# Patient Record
Sex: Male | Born: 1975
Health system: Southern US, Community
[De-identification: ages and names within clinical notes are randomized; demographics above are authoritative.]

## PROBLEM LIST (undated history)

## (undated) DIAGNOSIS — A630 Anogenital (venereal) warts: Secondary | ICD-10-CM

## (undated) DIAGNOSIS — T7840XA Allergy, unspecified, initial encounter: Secondary | ICD-10-CM

## (undated) HISTORY — DX: Anogenital (venereal) warts: A63.0

## (undated) HISTORY — DX: Gilbert syndrome: E80.4

## (undated) HISTORY — DX: Allergy, unspecified, initial encounter: T78.40XA

---

## 1999-04-25 HISTORY — PX: LEG SURGERY: SHX1003

## 1999-05-02 ENCOUNTER — Encounter: Payer: Self-pay | Admitting: *Deleted

## 1999-05-02 ENCOUNTER — Ambulatory Visit (HOSPITAL_COMMUNITY): Admission: RE | Admit: 1999-05-02 | Discharge: 1999-05-02 | Payer: Self-pay | Admitting: *Deleted

## 2000-03-08 ENCOUNTER — Ambulatory Visit (HOSPITAL_BASED_OUTPATIENT_CLINIC_OR_DEPARTMENT_OTHER): Admission: RE | Admit: 2000-03-08 | Discharge: 2000-03-08 | Payer: Self-pay | Admitting: Orthopedic Surgery

## 2000-03-08 ENCOUNTER — Encounter (INDEPENDENT_AMBULATORY_CARE_PROVIDER_SITE_OTHER): Payer: Self-pay | Admitting: Specialist

## 2009-09-22 HISTORY — PX: FRACTURE SURGERY: SHX138

## 2009-11-05 ENCOUNTER — Ambulatory Visit (HOSPITAL_BASED_OUTPATIENT_CLINIC_OR_DEPARTMENT_OTHER): Admission: RE | Admit: 2009-11-05 | Discharge: 2009-11-06 | Payer: Self-pay | Admitting: Orthopedic Surgery

## 2010-07-09 LAB — POCT HEMOGLOBIN-HEMACUE: Hemoglobin: 14.6 g/dL (ref 13.0–17.0)

## 2010-09-09 NOTE — Op Note (Signed)
[place]  Patient:    Kelly Bush, Kelly Bush                         MRN: [id number] Proc. Date: 03/08/00 Adm. Date:  [id number] Attending:  Colbert Ewing                           Operative Report  PREOPERATIVE DIAGNOSIS:  Osteoid osteoma left distal fibula.  POSTOPERATIVE DIAGNOSIS:  Osteoid osteoma left distal fibula.  OPERATIVE PROCEDURE:  Excision osteoid osteoma and surrounding reactive bone distal fibula - left.  SURGEON:  Loreta Ave, M.D.  ASSISTANT:  Arlys John D. Petrarca, P.A.-C.  ANESTHESIA:  General.  BLOOD LOSS:  Minimal.  SPECIMENS:  Excised osteoid osteoma.  CULTURES:  None.  COMPLICATIONS:  None.  DRESSING:  Soft compressive.  TOURNIQUET TIME:  One hour.  DESCRIPTION OF PROCEDURE:  Patient brought to the operating room and after adequate anesthesia had been obtained, tourniquet applied about the upper aspect of the left leg.  Prepped and draped in usual sterile fashion. Exsanguinated with elevation, Esmarch tourniquet inflated to 300 mmHg. Straight incision over the fibula centralized over the lesion, which was on the anterolateral aspect of the fibula, 10-15 cm above the ankle joint. Subperiosteal exposure of the fibula.  The nidus within the fibula was isolated.  The bone was sequentially removed, removing all the reactive, hypertrophied bone down to the level of the lesion.  The lesion identified and excised in its entirety and sent for pathology.  The surrounding bone around this was thoroughly curetted to be sure all of the benign osteoid osteoma had been removed.  The fibula itself was then contoured smoothly with a combination of osteotomes and rongeurs to a smooth surface.  Sufficient cortical bone still remaining that I did not need to do any plate reinforcement after removal of the lesion.  Fluoroscopy was used to confirm adequate excision as well as, to confirm good bony contouring.  Wound  was thoroughly irrigated and closed in a layered manner with Vicryl and Steri-Strips.   Margins of the wound injected with Marcaine with epinephrine. Sterile compressive dressing applied.  Tourniquet deflated and removed. Anesthesia reversed.  Brought to recovery room.  Tolerated surgery well, no complications. DD:  03/08/00 TD:  03/08/00 Job: [id number]

## 2011-12-20 ENCOUNTER — Ambulatory Visit (INDEPENDENT_AMBULATORY_CARE_PROVIDER_SITE_OTHER): Payer: BC Managed Care – PPO | Admitting: Family

## 2011-12-20 ENCOUNTER — Encounter: Payer: Self-pay | Admitting: Family

## 2011-12-20 VITALS — BP 110/70 | HR 55 | Temp 98.1°F | Resp 16 | Ht 70.0 in | Wt 162.0 lb

## 2011-12-20 DIAGNOSIS — T148XXA Other injury of unspecified body region, initial encounter: Secondary | ICD-10-CM

## 2011-12-20 DIAGNOSIS — A630 Anogenital (venereal) warts: Secondary | ICD-10-CM

## 2011-12-20 NOTE — Assessment & Plan Note (Addendum)
No obvious hernia on exam today.  Recommended that he avoid sports for the next 1 week, then resume as tolerated. Also instructed him on short course of motrin prn.  Pt is instructed to let us know if pain worsens or if no improvement in the next few weeks.

## 2011-12-20 NOTE — Patient Instructions (Addendum)
Please use motrin as needed for the next 1 week. Call if symptoms worsen or if no improvement. Schedule a fasting physical at your convenience.

## 2011-12-20 NOTE — Progress Notes (Signed)
Subjective:    Patient ID: Kelly Bush, male    DOB: Mar 04, 1976, 36 y.o.   MRN: [id number]  HPI  Mr.  Bush is a 36 yr old male who presents today to establish care. He has chief complaint of lower abdominal/groin pain. Reports that he plays soccer every tues/wed.  Notes occasional left groin pain or right groin pain.  Worse when playing soccer.  Hurts with walking/sneezing/straining.  First noted groin pain several months ago. It has been intermittent in nature.  For the last 1 week he has noted some lower abdominal discomfort which is midline.  Denies n/v/hematochezia.  Denies constipation/diarrhea.    Genital warts- went to dermatologist in [place]- 2 years ago.  Returned.  He will make apt for removal.  He has had it frozen.    Reports osteoma 2001 left leg.    Review of Systems  Constitutional: Negative for unexpected weight change.  HENT: Negative for hearing loss and postnasal drip.   Eyes: Negative for visual disturbance.  Respiratory: Negative for cough.   Cardiovascular: Negative for chest pain.  Gastrointestinal: Negative for nausea, vomiting and diarrhea.  Genitourinary: Negative for frequency and hematuria.  Musculoskeletal: Negative for myalgias and arthralgias.  Neurological: Negative for headaches.  Hematological: Negative for adenopathy.  Psychiatric/Behavioral:       Denies depression or anxiety   Past Medical History  Diagnosis Date  . Genital warts     History   Social History  . Marital Status: Married    Spouse Name: N/A    Number of Children: 0  . Years of Education: N/A   Occupational History  .     Social History Main Topics  . Smoking status: Never Smoker   . Smokeless tobacco: Never Used  . Alcohol Use: Yes     once every 1 - 2 weeks  . Drug Use: Not on file  . Sexually Active: Not on file   Other Topics Concern  . Not on file   Social History Narrative   Regular exercise:  3 x weeklyWorks for localLives with 2 roomates.Bachelors  degreeNo Surveyor, minerals, movies, sports    Past Surgical History  Procedure Date  . Fracture surgery 09/2009    right leg fx, has rod in leg  . Leg surgery 2001    left leg surgery ?osteoma?    Family History  Problem Relation Age of Onset  . Diabetes Paternal Aunt   . Cancer Maternal Grandmother     breast    No Known Allergies  No current outpatient prescriptions on file prior to visit.    BP 110/70  Pulse 55  Temp 98.1 F (36.7 C) (Oral)  Resp 16  Ht 5\' 10"  (1.778 m)  Wt 162 lb (73.483 kg)  BMI 23.24 kg/m2  SpO2 99%       Objective:   Physical Exam  Constitutional: He is oriented to person, place, and time. He appears well-developed and well-nourished. No distress.  HENT:  Head: Normocephalic and atraumatic.  Cardiovascular: Normal rate and regular rhythm.   No murmur heard. Pulmonary/Chest: Effort normal and breath sounds normal. No respiratory distress. He has no wheezes. He has no rales. He exhibits no tenderness.  Abdominal: Soft. Bowel sounds are normal. He exhibits no distension and no mass. There is no tenderness. There is no rebound and no guarding.  Genitourinary:       No inguinal hernia palpated on right or left with cough.    Neurological: He is alert  and oriented to person, place, and time.  Skin: Skin is warm and dry.  Psychiatric: He has a normal mood and affect. His behavior is normal. Judgment and thought content normal.          Assessment & Plan:

## 2011-12-20 NOTE — Assessment & Plan Note (Signed)
Pt plans to arrange follow up with dermatology.

## 2012-07-18 ENCOUNTER — Ambulatory Visit (INDEPENDENT_AMBULATORY_CARE_PROVIDER_SITE_OTHER): Payer: BC Managed Care – PPO | Admitting: Family

## 2012-07-18 ENCOUNTER — Encounter: Payer: Self-pay | Admitting: Family

## 2012-07-18 VITALS — BP 104/72 | HR 58 | Temp 98.1°F | Resp 16 | Wt 170.1 lb

## 2012-07-18 DIAGNOSIS — R109 Unspecified abdominal pain: Secondary | ICD-10-CM

## 2012-07-18 DIAGNOSIS — Z0000 Encounter for general adult medical examination without abnormal findings: Secondary | ICD-10-CM | POA: Insufficient documentation

## 2012-07-18 DIAGNOSIS — R1030 Lower abdominal pain, unspecified: Secondary | ICD-10-CM | POA: Insufficient documentation

## 2012-07-18 LAB — CBC WITH DIFFERENTIAL/PLATELET
Basophils Absolute: 0 10*3/uL (ref 0.0–0.1)
HCT: 45.9 % (ref 39.0–52.0)
Hemoglobin: 15.8 g/dL (ref 13.0–17.0)
Lymphocytes Relative: 39 % (ref 12–46)
Monocytes Absolute: 0.6 10*3/uL (ref 0.1–1.0)
Monocytes Relative: 10 % (ref 3–12)
Neutro Abs: 3 10*3/uL (ref 1.7–7.7)
RBC: 5.32 MIL/uL (ref 4.22–5.81)
WBC: 6.1 10*3/uL (ref 4.0–10.5)

## 2012-07-18 NOTE — Patient Instructions (Addendum)
Please complete your lab work prior to leaving.  You will be contacted about your CT scan to evaluate for possible hernia. We will contact you with your results. Follow up in 1 year, sooner if problems/concerns.

## 2012-07-18 NOTE — Assessment & Plan Note (Signed)
Inguinal hernia exam neg last visit, but symptoms persist. Will obtain CT to further evaluate.

## 2012-07-18 NOTE — Assessment & Plan Note (Signed)
We discussed the importance of healthy diet.  Obtain fasting lab work.

## 2012-07-18 NOTE — Progress Notes (Signed)
Subjective:    Patient ID: Kelly Bush, male    DOB: January 09, 1976, 37 y.o.   MRN: [id number]  HPI  Preventative-  Reports last tetanus was <10 yrs. Exercise- Not exercising regularly.  Diet-  Reports that he started multivitamin.  Cooks a little.  Takes sandwiches for work.  Pt reports that he continues to have bilateral groin pain when he exercises.    Review of Systems  Constitutional: Negative for unexpected weight change.  HENT: Negative for hearing loss and congestion.   Eyes: Negative for visual disturbance.  Respiratory: Negative for cough and shortness of breath.   Cardiovascular: Negative for chest pain.  Gastrointestinal: Negative for nausea, vomiting and diarrhea.  Genitourinary: Negative for dysuria and frequency.  Neurological: Negative for headaches.  Hematological: Negative for adenopathy.  Psychiatric/Behavioral:       Denies depression/anxiety.   Past Medical History  Diagnosis Date  . Genital warts     History   Social History  . Marital Status: Married    Spouse Name: N/A    Number of Children: 0  . Years of Education: N/A   Occupational History  .     Social History Main Topics  . Smoking status: Never Smoker   . Smokeless tobacco: Never Used  . Alcohol Use: Yes     Comment: once every 1 - 2 weeks  . Drug Use: Not on file  . Sexually Active: Not on file   Other Topics Concern  . Not on file   Social History Narrative   Regular exercise:  3 x weekly   Works for [employer] with 2 roomates.   Bachelors degree   No children   Has Market researcher, movies, sports          Past Surgical History  Procedure Laterality Date  . Fracture surgery  09/2009    right leg fx, has rod in leg  . Leg surgery  2001    left leg surgery ?osteoma?    Family History  Problem Relation Age of Onset  . Diabetes Paternal Aunt   . Cancer Maternal Grandmother     breast    No Known Allergies  No current outpatient prescriptions on file prior to  visit.   No current facility-administered medications on file prior to visit.    BP 104/72  Pulse 58  Temp(Src) 98.1 F (36.7 C) (Oral)  Resp 16  Wt 170 lb 1.9 oz (77.166 kg)  BMI 24.41 kg/m2  SpO2 99%        Objective:   Physical Exam  Physical Exam  Constitutional: He is oriented to person, place, and time. He appears well-developed and well-nourished. No distress.  HENT:  Head: Normocephalic and atraumatic.  Right Ear: Tympanic membrane and ear canal normal.  Left Ear: Tympanic membrane and ear canal normal.  Mouth/Throat: Oropharynx is clear and moist.  Eyes: Pupils are equal, round, and reactive to light. No scleral icterus.  Neck: Normal range of motion. No thyromegaly present.  Cardiovascular: Normal rate and regular rhythm.   No murmur heard. Pulmonary/Chest: Effort normal and breath sounds normal. No respiratory distress. He has no wheezes. He has no rales. He exhibits no tenderness.  Abdominal: Soft. Bowel sounds are normal. He exhibits no distension and no mass. There is no tenderness. There is no rebound and no guarding.  Musculoskeletal: He exhibits no edema.  Lymphadenopathy:    He has no cervical adenopathy.  Neurological: He is alert and oriented  to person, place, and time.  He exhibits normal muscle tone. Coordination normal.  Skin: Skin is warm and dry.  Psychiatric: He has a normal mood and affect. His behavior is normal. Judgment and thought content normal.          Assessment & Plan:          Assessment & Plan:

## 2012-07-19 ENCOUNTER — Ambulatory Visit (HOSPITAL_BASED_OUTPATIENT_CLINIC_OR_DEPARTMENT_OTHER): Payer: BC Managed Care – PPO

## 2012-07-19 LAB — HEPATIC FUNCTION PANEL
ALT: 19 U/L (ref 0–53)
AST: 19 U/L (ref 0–37)
Albumin: 4.9 g/dL (ref 3.5–5.2)
Alkaline Phosphatase: 40 U/L (ref 39–117)
Bilirubin, Direct: 0.2 mg/dL (ref 0.0–0.3)
Indirect Bilirubin: 1.2 mg/dL — ABNORMAL HIGH (ref 0.0–0.9)
Total Bilirubin: 1.4 mg/dL — ABNORMAL HIGH (ref 0.3–1.2)
Total Protein: 7.6 g/dL (ref 6.0–8.3)

## 2012-07-19 LAB — BASIC METABOLIC PANEL WITH GFR
BUN: 18 mg/dL (ref 6–23)
CO2: 30 mEq/L (ref 19–32)
Calcium: 10 mg/dL (ref 8.4–10.5)
Chloride: 100 mEq/L (ref 96–112)
Creat: 1.06 mg/dL (ref 0.50–1.35)
GFR, Est African American: >89 mL/min
GFR, Est Non African American: >89 mL/min
Glucose, Bld: 80 mg/dL (ref 70–99)
Potassium: 4.5 mEq/L (ref 3.5–5.3)
Sodium: 139 mEq/L (ref 135–145)

## 2012-07-19 LAB — LIPID PANEL
Cholesterol: 210 mg/dL — ABNORMAL HIGH (ref 0–200)
HDL: 69 mg/dL (ref >39)
LDL Cholesterol: 130 mg/dL — ABNORMAL HIGH (ref 0–99)
Total CHOL/HDL Ratio: 3 Ratio
Triglycerides: 53 mg/dL (ref <150)
VLDL: 11 mg/dL (ref 0–40)

## 2012-07-19 LAB — URINALYSIS, ROUTINE W REFLEX MICROSCOPIC
Bilirubin Urine: NEGATIVE
Glucose, UA: NEGATIVE mg/dL
Leukocytes, UA: NEGATIVE
Specific Gravity, Urine: 1.01 (ref 1.005–1.030)
Urobilinogen, UA: 0.2 mg/dL (ref 0.0–1.0)

## 2012-07-19 LAB — HIV ANTIBODY (ROUTINE TESTING W REFLEX): HIV: NONREACTIVE

## 2012-07-19 LAB — TSH: TSH: 1.671 u[IU]/mL (ref 0.350–4.500)

## 2012-08-06 ENCOUNTER — Telehealth: Payer: Self-pay | Admitting: *Deleted

## 2012-08-06 NOTE — Telephone Encounter (Signed)
Message copied by Regis Bill on Tue Aug 06, 2012  4:16 PM ------      Message from: O'SULLIVAN, MELISSA      Created: Tue Jul 23, 2012  8:30 PM       Kidney function, sugar, urine,  thyroid, blood count normal. Cholesterol mildly elevated. Work on low fat low chol diet. hiv neg. Mild elevation of bilirubin, one of liver tests. Not dangerous. Probably normal for him. Repeat lft in 3 months. Dx hyperbilirubinemia. ------

## 2012-08-06 NOTE — Telephone Encounter (Signed)
Patient informed, understood & agreed; future lab order placed/SLS  

## 2012-11-07 LAB — HEPATIC FUNCTION PANEL
Albumin: 4.4 g/dL (ref 3.5–5.2)
Alkaline Phosphatase: 43 U/L (ref 39–117)
Total Bilirubin: 1.3 mg/dL — ABNORMAL HIGH (ref 0.3–1.2)

## 2014-02-04 ENCOUNTER — Encounter: Payer: BC Managed Care – PPO | Admitting: Family

## 2014-02-23 ENCOUNTER — Ambulatory Visit (INDEPENDENT_AMBULATORY_CARE_PROVIDER_SITE_OTHER): Payer: BC Managed Care – PPO | Admitting: Family

## 2014-02-23 ENCOUNTER — Ambulatory Visit (INDEPENDENT_AMBULATORY_CARE_PROVIDER_SITE_OTHER): Payer: BC Managed Care – PPO | Admitting: *Deleted

## 2014-02-23 ENCOUNTER — Encounter: Payer: Self-pay | Admitting: Family

## 2014-02-23 VITALS — HR 52 | Temp 98.0°F | Resp 16 | Ht 70.0 in | Wt 161.6 lb

## 2014-02-23 DIAGNOSIS — Z23 Encounter for immunization: Secondary | ICD-10-CM

## 2014-02-23 DIAGNOSIS — Z0000 Encounter for general adult medical examination without abnormal findings: Secondary | ICD-10-CM

## 2014-02-23 LAB — TSH: TSH: 1.14 u[IU]/mL (ref 0.35–4.50)

## 2014-02-23 LAB — CBC WITH DIFFERENTIAL/PLATELET
BASOS PCT: 0.6 % (ref 0.0–3.0)
Basophils Absolute: 0 10*3/uL (ref 0.0–0.1)
EOS PCT: 1.7 % (ref 0.0–5.0)
Eosinophils Absolute: 0.1 10*3/uL (ref 0.0–0.7)
HCT: 46.7 % (ref 39.0–52.0)
Hemoglobin: 15.4 g/dL (ref 13.0–17.0)
LYMPHS ABS: 1.9 10*3/uL (ref 0.7–4.0)
Lymphocytes Relative: 32.5 % (ref 12.0–46.0)
MCHC: 33.1 g/dL (ref 30.0–36.0)
MCV: 91.3 fl (ref 78.0–100.0)
MONOS PCT: 9.5 % (ref 3.0–12.0)
Monocytes Absolute: 0.5 10*3/uL (ref 0.1–1.0)
Neutro Abs: 3.2 10*3/uL (ref 1.4–7.7)
Neutrophils Relative %: 55.7 % (ref 43.0–77.0)
PLATELETS: 257 10*3/uL (ref 150.0–400.0)
RBC: 5.11 Mil/uL (ref 4.22–5.81)
RDW: 13.4 % (ref 11.5–15.5)
WBC: 5.7 10*3/uL (ref 4.0–10.5)

## 2014-02-23 LAB — URINALYSIS, ROUTINE W REFLEX MICROSCOPIC
BILIRUBIN URINE: NEGATIVE
Hgb urine dipstick: NEGATIVE
KETONES UR: NEGATIVE
Leukocytes, UA: NEGATIVE
Nitrite: NEGATIVE
RBC / HPF: NONE SEEN (ref 0–?)
Specific Gravity, Urine: 1.01 (ref 1.000–1.030)
TOTAL PROTEIN, URINE-UPE24: NEGATIVE
Urine Glucose: NEGATIVE
Urobilinogen, UA: 0.2 (ref 0.0–1.0)
WBC, UA: NONE SEEN (ref 0–?)
pH: 7 (ref 5.0–8.0)

## 2014-02-23 LAB — BASIC METABOLIC PANEL
BUN: 16 mg/dL (ref 6–23)
CO2: 27 mEq/L (ref 19–32)
Calcium: 10 mg/dL (ref 8.4–10.5)
Chloride: 100 mEq/L (ref 96–112)
Creatinine, Ser: 1.1 mg/dL (ref 0.4–1.5)
GFR: 84.55 mL/min (ref >60.00)
Glucose, Bld: 78 mg/dL (ref 70–99)
Potassium: 4.1 mEq/L (ref 3.5–5.1)
SODIUM: 137 meq/L (ref 135–145)

## 2014-02-23 LAB — HEPATIC FUNCTION PANEL
ALT: 14 U/L (ref 0–53)
AST: 21 U/L (ref 0–37)
Albumin: 4.2 g/dL (ref 3.5–5.2)
Alkaline Phosphatase: 48 U/L (ref 39–117)
BILIRUBIN DIRECT: 0.2 mg/dL (ref 0.0–0.3)
BILIRUBIN TOTAL: 1.9 mg/dL — AB (ref 0.2–1.2)
TOTAL PROTEIN: 8.3 g/dL (ref 6.0–8.3)

## 2014-02-23 LAB — LIPID PANEL
Cholesterol: 217 mg/dL — ABNORMAL HIGH (ref 0–200)
HDL: 71.4 mg/dL (ref >39.00)
LDL CALC: 135 mg/dL — AB (ref 0–99)
NonHDL: 145.6
TRIGLYCERIDES: 52 mg/dL (ref 0.0–149.0)
Total CHOL/HDL Ratio: 3
VLDL: 10.4 mg/dL (ref 0.0–40.0)

## 2014-02-23 NOTE — Patient Instructions (Signed)
Please complete lab work prior to leaving. Follow up in 1 year, sooner if problems/concerns.  

## 2014-02-23 NOTE — Progress Notes (Signed)
Pre visit review using our clinic review tool, if applicable. No additional management support is needed unless otherwise documented below in the visit note. 

## 2014-02-23 NOTE — Progress Notes (Signed)
Subjective:    Patient ID: Kelly Bush, male    DOB: 09/30/1975, 38 y.o.   MRN: [id number]  HPI  Patient presents today for complete physical.  Immunizations: reports that last tetanus was about 7 years.  Diet: reports healthy diet.  Exercise: enjoys soccer 3 times a week    Review of Systems  Constitutional: Negative for unexpected weight change.  HENT: Negative for hearing loss and rhinorrhea.   Eyes: Negative for visual disturbance.  Respiratory: Negative for cough and shortness of breath.   Cardiovascular: Negative for chest pain and leg swelling.  Gastrointestinal: Negative for nausea, vomiting and diarrhea.  Genitourinary: Negative for dysuria and frequency.  Musculoskeletal: Negative for myalgias and arthralgias.  Skin: Negative for rash.  Neurological: Negative for headaches.  Hematological: Negative for adenopathy.  Psychiatric/Behavioral:       Denies depression/anxiety   Past Medical History  Diagnosis Date  . Genital warts     History   Social History  . Marital Status: Married    Spouse Name: N/A    Number of Children: 0  . Years of Education: N/A   Occupational History  .     Social History Main Topics  . Smoking status: Never Smoker   . Smokeless tobacco: Never Used  . Alcohol Use: 0.6 oz/week    1 Not specified per week     Comment: once every 1 - 2 weeks  . Drug Use: Not on file  . Sexual Activity: Not on file   Other Topics Concern  . 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   Bachelors degree   No children   Has Market researcherdog   Soccer, movies, sports          Past Surgical History  Procedure Laterality Date  . Fracture surgery  09/2009    right leg fx, has rod in leg  . Leg surgery  2001    left leg surgery ?osteoma?    Family History  Problem Relation Age of Onset  . Diabetes Paternal Aunt   . Cancer Maternal  Grandmother     breast    No Known Allergies  No current outpatient prescriptions on file prior to visit.   No current facility-administered medications on file prior to visit.    Pulse 52  Temp(Src) 98 F (36.7 C) (Oral)  Resp 16  Ht 5\' 10"  (1.778 m)  Wt 161 lb 9.6 oz (73.301 kg)  BMI 23.19 kg/m2  SpO2 100%       Objective:   Physical Exam  Physical Exam  Constitutional: He is oriented to person, place, and time. He appears well-developed and well-nourished. No distress.  HENT:  Head: Normocephalic and atraumatic.  Right Ear: Tympanic membrane and ear canal normal.  Left Ear: Tympanic membrane and ear canal normal.  Mouth/Throat: Oropharynx is clear and moist.  Eyes: Pupils are equal, round, and reactive to light. No scleral icterus.  Neck: Normal range of motion. No thyromegaly present.  Cardiovascular: Normal rate and regular rhythm.   No murmur heard. Pulmonary/Chest: Effort normal and breath sounds normal. No respiratory distress. He has no wheezes. He has no rales. He exhibits no tenderness.  Abdominal: Soft. Bowel sounds are normal. He exhibits no distension and no mass. There is no tenderness. There is no rebound and no guarding.  Musculoskeletal: He exhibits  no edema.  Lymphadenopathy:    He has no cervical adenopathy.  Neurological: He is alert and oriented to person, place, and time. He has normal patellar reflexes. He exhibits normal muscle tone. Coordination normal.  Skin: Skin is warm and dry.  Psychiatric: He has a normal mood and affect. His behavior is normal. Judgment and thought content normal.          Assessment & Plan:         Assessment & Plan:

## 2014-02-23 NOTE — Assessment & Plan Note (Signed)
Pt has improved his diet and is hopeful that his cholesterol is improved. Continue healthy diet, exercise, flu shot today.

## 2014-02-25 ENCOUNTER — Encounter: Payer: Self-pay | Admitting: Family

## 2015-07-14 ENCOUNTER — Ambulatory Visit (INDEPENDENT_AMBULATORY_CARE_PROVIDER_SITE_OTHER): Payer: BLUE CROSS/BLUE SHIELD | Admitting: Medical

## 2015-07-14 ENCOUNTER — Encounter: Payer: Self-pay | Admitting: Medical

## 2015-07-14 VITALS — BP 98/70 | HR 90 | Temp 98.1°F | Ht 70.0 in | Wt 171.0 lb

## 2015-07-14 DIAGNOSIS — M5431 Sciatica, right side: Secondary | ICD-10-CM | POA: Diagnosis not present

## 2015-07-14 DIAGNOSIS — R066 Hiccough: Secondary | ICD-10-CM

## 2015-07-14 MED ORDER — RANITIDINE HCL 150 MG PO CAPS: 150.0000 mg | ORAL_CAPSULE | Freq: Two times a day (BID) | ORAL | Status: DC

## 2015-07-14 MED ORDER — DICLOFENAC SODIUM 75 MG PO TBEC: 75.0000 mg | DELAYED_RELEASE_TABLET | Freq: Two times a day (BID) | ORAL | Status: DC

## 2015-07-14 NOTE — Patient Instructions (Signed)
For your sciatica attend PT and I will rx diclofenac. You associate your hiccups to recent meds written for sciatica. So stop those temporarily until we see how you are doing and advise.  For hiccups and some belching, I will rx ranitidine. Since you hiccups are severe I am considering reglan but want to see your response to ranitidine first. If hiccups don't respond to ranitidine   may rx reglan but first want you to be aware of tardive dyskinesia as potential side effect.   I don't want to try reglan first and want you to fully think this over before you decide.  Follow up in 5 days or as needed

## 2015-07-14 NOTE — Progress Notes (Signed)
Pre visit review using our clinic review tool, if applicable. No additional management support is needed unless otherwise documented below in the visit note. 

## 2015-07-14 NOTE — Progress Notes (Signed)
Subjective:    Patient ID: Kelly Bush, male    DOB: 06-01-1975, 40 y.o.   MRN: [id number]  HPI   Pt in with some lower back pain  X 1 month and some rt sciatica area pain since Saturday a week ago. Pt states pain got worse after playing soccer indoors. Pt went to chiropracter and then went to urgent care. He was given prednisone, hydrocodone and muscle relexant. Pain in back and rt sciatic area did not improve much. . Pt has appointment with PT. Pt thinks pain has not improved much. Pain about 8/10. Some pain radiating from buttox down to rt calf.  Pt started to hiccup a lot this Monday. It is constant and he belches intermittent. Denies history of reflux. Pt called urgent care and they advised to prednisone and hydrocodone. Advised to take Mg supplement.   No abdomen pain reported. See ros.    Review of Systems  Constitutional: Negative for fever, chills and fatigue.  Respiratory: Negative for cough, shortness of breath and wheezing.   Cardiovascular: Negative for chest pain and palpitations.  Gastrointestinal: Negative for nausea, abdominal pain, diarrhea, constipation, blood in stool and abdominal distention.       Hiccups constant. Intermittent belch.  Musculoskeletal: Positive for back pain.       See hpi.  Neurological:       Some sciatica type pain.   Past Medical History  Diagnosis Date  . Genital warts     Social History   Social History  . Marital Status: Married    Spouse Name: N/A  . Number of Children: 0  . Years of Education: N/A   Occupational History  .     Social History Main Topics  . Smoking status: Never Smoker   . Smokeless tobacco: Never Used  . Alcohol Use: 0.6 oz/week    1 Standard drinks or equivalent per week     Comment: once every 1 - 2 weeks  . Drug Use: Not on file  . Sexual Activity: Not on file   Other Topics Concern  . 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   Bachelors degree   No children   Has Market researcher, movies, sports          Past Surgical History  Procedure Laterality Date  . Fracture surgery  09/2009    right leg fx, has rod in leg  . Leg surgery  2001    left leg surgery ?osteoma?    Family History  Problem Relation Age of Onset  . Diabetes Paternal Aunt   . Cancer Maternal Grandmother     breast    No Known Allergies  No current outpatient prescriptions on file prior to visit.   No current facility-administered medications on file prior to visit.    BP 98/70 mmHg  Pulse 90  Temp(Src) 98.1 F (36.7 C) (Oral)  Ht  (1.778 m)  Wt 171 lb (77.565 kg)  BMI 24.54 kg/m2  SpO2 98%       Objective:   Physical Exam  General Appearance- Not in acute distress.    Chest and Lung Exam Auscultation: Breath sounds:-Normal. Clear even and unlabored. Adventitious sounds:- No Adventitious sounds.  Cardiovascular Auscultation:Rythm - Regular, rate and rythm. Heart Sounds -Normal heart sounds.  Abdomen Inspection:-Inspection Normal.  Palpation/Perucssion:  Palpation and Percussion of the abdomen reveal- Non Tender, No Rebound tenderness, No rigidity(Guarding) and No Palpable abdominal masses.  Liver:-Normal.  Spleen:- Normal.   Back NO mid lumbar spine tenderness to palpation. Rt si tenderness to palpation directly. Pain on straight leg lift rt leg. Pain on lateral movements and flexion/extension of the spine.  Lower ext neurologic  L5-S1 sensation intact bilaterally. Normal patellar reflexes bilaterally. No foot drop bilaterally.      Assessment & Plan:  For your sciatica attend PT and I will rx diclofenac. You associate your hiccups to recent meds written for sciatica. So stop those temporarily until we see how you are doing and advise.  For hiccups and some belching, I will rx ranitidine. Since you hiccups are severe I am considering  reglan but want to see your response to ranitidine first. If hiccups don't respond to ranitidine   may rx reglan but first want you to be aware of tardive dyskinesia as potential side effect.   I don't want to try reglan first and want you to fully think this over before you decide.  Follow up in 5 days or as needed

## 2015-07-15 ENCOUNTER — Telehealth: Payer: Self-pay | Admitting: Family

## 2015-07-15 NOTE — Telephone Encounter (Signed)
Can be reached: [phone] Pharmacy: [id number] - [place], [place] - [address]  Reason for call: pt states that he still has the hiccups and the medication doesn't seem to be helping from yesterday. He is asking if there are any other meds to help.

## 2015-07-15 NOTE — Telephone Encounter (Signed)
Edward please advise.  

## 2015-07-15 NOTE — Telephone Encounter (Signed)
I did offer him reglan if zantac does not work. But when I called him today hee states hiccups  had gotten better. He will continue zantac. Also he thinks eating plain bread has helped. So will hold off for now. If gets worse he will notify me and at that point will rx reglan if he wants.

## 2015-08-09 ENCOUNTER — Encounter: Payer: Self-pay | Admitting: Family

## 2015-08-09 ENCOUNTER — Ambulatory Visit (INDEPENDENT_AMBULATORY_CARE_PROVIDER_SITE_OTHER): Payer: BLUE CROSS/BLUE SHIELD | Admitting: Family

## 2015-08-09 VITALS — BP 119/79 | HR 70 | Temp 97.8°F | Resp 18 | Ht 70.0 in | Wt 171.2 lb

## 2015-08-09 DIAGNOSIS — M5430 Sciatica, unspecified side: Secondary | ICD-10-CM | POA: Insufficient documentation

## 2015-08-09 DIAGNOSIS — M5431 Sciatica, right side: Secondary | ICD-10-CM | POA: Diagnosis not present

## 2015-08-09 DIAGNOSIS — Z0000 Encounter for general adult medical examination without abnormal findings: Secondary | ICD-10-CM

## 2015-08-09 DIAGNOSIS — Z0001 Encounter for general adult medical examination with abnormal findings: Secondary | ICD-10-CM | POA: Diagnosis not present

## 2015-08-09 MED ORDER — METHYLPREDNISOLONE 4 MG PO TBPK: ORAL_TABLET | ORAL | Status: DC

## 2015-08-09 NOTE — Progress Notes (Signed)
Subjective:    Patient ID: Kelly Bush, male    DOB: 05-28-1975, 40 y.o.   MRN: [id number]  HPI  Kelly Bush is a 40 yr old male who presents today for cpx.  He also wishes to discuss sciatic pain. Improving but still hurts.  Pain radiates into the right buttock if he coughs or uses the bathroom. Using naproxen and muscle relaxant. Reports that he only took steroids x 2 days because he developed hiccups.  Immunizations: tdap up to date Diet: fair diet Exercise: usually plays soccer but limited due back pain Dental: due  Eye exam: due  Review of Systems  Constitutional: Negative for unexpected weight change.  HENT: Negative for rhinorrhea.   Respiratory: Negative for cough.   Cardiovascular: Negative for leg swelling.  Gastrointestinal: Negative for diarrhea and constipation.  Genitourinary: Negative for dysuria, frequency and hematuria.  Musculoskeletal: Positive for back pain.  Neurological: Negative for headaches.  Hematological: Negative for adenopathy.  Psychiatric/Behavioral:       Denies depression/anxiety   Past Medical History  Diagnosis Date  . Genital warts      Social History   Social History  . Marital Status: Married    Spouse Name: N/A  . Number of Children: 0  . Years of Education: N/A   Occupational History  .     Social History Main Topics  . Smoking status: Never Smoker   . Smokeless tobacco: Never Used  . Alcohol Use: 0.6 oz/week    1 Standard drinks or equivalent per week     Comment: once every 1 - 2 weeks  . Drug Use: Not on file  . Sexual Activity: Not on file   Other Topics Concern  . 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   Bachelors degree   No children   Soccer, movies, sports          Past Surgical History  Procedure Laterality Date  . Leg surgery  2001    left leg surgery ?osteoma?  .  Fracture surgery  09/2009    right leg fx, has rod in leg    Family History  Problem Relation Age of Onset  . Diabetes Paternal Aunt   . Cancer Maternal Grandmother     breast  . Hypertension Mother   . Hypertension Father   . Hyperlipidemia Brother     No Known Allergies  Current Outpatient Prescriptions on File Prior to Visit  Medication Sig Dispense Refill  . cyclobenzaprine (FLEXERIL) 10 MG tablet   0   No current facility-administered medications on file prior to visit.    BP 119/79 mmHg  Pulse 70  Temp(Src) 97.8 F (36.6 C) (Oral)  Resp 18  Ht  (1.778 m)  Wt 171 lb 3.2 oz (77.656 kg)  BMI 24.56 kg/m2  SpO2 100%       Objective:   Physical Exam Physical Exam  Constitutional: He is oriented to person, place, and time. He appears well-developed and well-nourished. No distress.  HENT:  Head: Normocephalic and atraumatic.  Right Ear: Tympanic membrane and ear canal normal.  Left Ear: Tympanic membrane and ear canal normal.  Mouth/Throat: Oropharynx is clear and moist.  Eyes: Pupils are equal, round, and reactive to light. No scleral icterus.  Neck: Normal range of motion. No thyromegaly present.  Cardiovascular: Normal rate  and regular rhythm.   No murmur heard. Pulmonary/Chest: Effort normal and breath sounds normal. No respiratory distress. He has no wheezes. He has no rales. He exhibits no tenderness.  Abdominal: Soft. Bowel sounds are normal. He exhibits no distension and no mass. There is no tenderness. There is no rebound and no guarding.  Musculoskeletal: He exhibits no edema.  Lymphadenopathy:    He has no cervical adenopathy.  Neurological: He is alert and oriented to person, place, and time. He has normal patellar reflexes. He exhibits normal muscle tone. Coordination normal.  Skin: Skin is warm and dry.  Psychiatric: He has a normal mood and affect. His behavior is normal. Judgment and thought content normal.          Assessment & Plan:            Assessment & Plan:

## 2015-08-09 NOTE — Patient Instructions (Addendum)
Please continue healthy diet. Complete lab work prior to leaving. Start medrol dose pak (steroids) for your back. Continue physical therapy. Call if back pain worsens or if not improved in 2 weeks.  Schedule routine dental and eye exam.

## 2015-08-09 NOTE — Assessment & Plan Note (Addendum)
Discussed healthy diet, exercise when back is feeling better. Obtain routine non-fasting labs.  Recommended patient to scheduled routine eye and dental care.

## 2015-08-09 NOTE — Progress Notes (Signed)
Pre visit review using our clinic review tool, if applicable. No additional management support is needed unless otherwise documented below in the visit note. 

## 2015-08-09 NOTE — Assessment & Plan Note (Signed)
Will re-attempt steroids to see if he can tolerate.  I think it will help his pain. Advised him to continue PT. If symptoms worsen or if not improved in 2 weeks, consider MRI.

## 2015-08-10 ENCOUNTER — Encounter: Payer: Self-pay | Admitting: Family

## 2015-08-10 LAB — BASIC METABOLIC PANEL
BUN: 19 mg/dL (ref 6–23)
CHLORIDE: 102 meq/L (ref 96–112)
CO2: 31 mEq/L (ref 19–32)
CREATININE: 1.06 mg/dL (ref 0.40–1.50)
Calcium: 9.8 mg/dL (ref 8.4–10.5)
GFR: 82.97 mL/min (ref >60.00)
Glucose, Bld: 81 mg/dL (ref 70–99)
Potassium: 4.1 mEq/L (ref 3.5–5.1)
Sodium: 139 mEq/L (ref 135–145)

## 2015-08-10 LAB — URINALYSIS, ROUTINE W REFLEX MICROSCOPIC
BILIRUBIN URINE: NEGATIVE
Hgb urine dipstick: NEGATIVE
KETONES UR: NEGATIVE
Leukocytes, UA: NEGATIVE
Nitrite: NEGATIVE
PH: 5.5 (ref 5.0–8.0)
RBC / HPF: NONE SEEN (ref 0–?)
TOTAL PROTEIN, URINE-UPE24: NEGATIVE
UROBILINOGEN UA: 0.2 (ref 0.0–1.0)
Urine Glucose: NEGATIVE
WBC, UA: NONE SEEN (ref 0–?)

## 2015-08-10 LAB — CBC WITH DIFFERENTIAL/PLATELET
BASOS ABS: 0 10*3/uL (ref 0.0–0.1)
BASOS PCT: 0.5 % (ref 0.0–3.0)
EOS ABS: 0.2 10*3/uL (ref 0.0–0.7)
Eosinophils Relative: 3.7 % (ref 0.0–5.0)
HEMATOCRIT: 42.9 % (ref 39.0–52.0)
HEMOGLOBIN: 14.5 g/dL (ref 13.0–17.0)
LYMPHS PCT: 35.1 % (ref 12.0–46.0)
Lymphs Abs: 2.2 10*3/uL (ref 0.7–4.0)
MCHC: 33.7 g/dL (ref 30.0–36.0)
MCV: 89.6 fl (ref 78.0–100.0)
Monocytes Absolute: 0.5 10*3/uL (ref 0.1–1.0)
Monocytes Relative: 8.3 % (ref 3.0–12.0)
Neutro Abs: 3.2 10*3/uL (ref 1.4–7.7)
Neutrophils Relative %: 52.4 % (ref 43.0–77.0)
Platelets: 236 10*3/uL (ref 150.0–400.0)
RBC: 4.79 Mil/uL (ref 4.22–5.81)
RDW: 13 % (ref 11.5–15.5)
WBC: 6.2 10*3/uL (ref 4.0–10.5)

## 2015-08-10 LAB — LIPID PANEL
CHOL/HDL RATIO: 3
Cholesterol: 201 mg/dL — ABNORMAL HIGH (ref 0–200)
HDL: 59.1 mg/dL (ref >39.00)
LDL CALC: 120 mg/dL — AB (ref 0–99)
NONHDL: 141.45
Triglycerides: 109 mg/dL (ref 0.0–149.0)
VLDL: 21.8 mg/dL (ref 0.0–40.0)

## 2015-08-10 LAB — HEPATIC FUNCTION PANEL
ALK PHOS: 39 U/L (ref 39–117)
ALT: 12 U/L (ref 0–53)
AST: 15 U/L (ref 0–37)
Albumin: 4.3 g/dL (ref 3.5–5.2)
BILIRUBIN DIRECT: 0.2 mg/dL (ref 0.0–0.3)
TOTAL PROTEIN: 7.2 g/dL (ref 6.0–8.3)
Total Bilirubin: 1 mg/dL (ref 0.2–1.2)

## 2015-08-10 LAB — TSH: TSH: 2.01 u[IU]/mL (ref 0.35–4.50)

## 2015-08-23 ENCOUNTER — Encounter: Payer: Self-pay | Admitting: Family

## 2015-08-24 ENCOUNTER — Encounter: Payer: Self-pay | Admitting: Family

## 2015-08-24 DIAGNOSIS — M5430 Sciatica, unspecified side: Secondary | ICD-10-CM

## 2015-08-24 NOTE — Telephone Encounter (Signed)
Spoke to patient. Pain is improving slowly. He is interested in finding out self pay cost for MRI's.  I gave him some imaging centers to call and price out. If he decides to proceed with MRI he will let me know.

## 2015-08-31 ENCOUNTER — Telehealth: Payer: Self-pay | Admitting: Family

## 2015-08-31 ENCOUNTER — Ambulatory Visit
Admission: RE | Admit: 2015-08-31 | Discharge: 2015-08-31 | Disposition: A | Discharge status: Home / Self Care | Payer: BLUE CROSS/BLUE SHIELD | Source: Ambulatory Visit | Attending: Family | Admitting: Family

## 2015-08-31 DIAGNOSIS — M5430 Sciatica, unspecified side: Secondary | ICD-10-CM

## 2015-08-31 DIAGNOSIS — M5431 Sciatica, right side: Secondary | ICD-10-CM

## 2015-08-31 NOTE — Telephone Encounter (Signed)
Reviewed MRI results with pt. Advised referral to neurosurgery. Pt is agreeable to referral. Will arrange.

## 2016-07-12 ENCOUNTER — Encounter: Payer: Self-pay | Admitting: Family

## 2016-07-12 ENCOUNTER — Ambulatory Visit (INDEPENDENT_AMBULATORY_CARE_PROVIDER_SITE_OTHER): Payer: PRIVATE HEALTH INSURANCE | Admitting: Family

## 2016-07-12 DIAGNOSIS — A630 Anogenital (venereal) warts: Secondary | ICD-10-CM

## 2016-07-12 DIAGNOSIS — Z0000 Encounter for general adult medical examination without abnormal findings: Secondary | ICD-10-CM

## 2016-07-12 DIAGNOSIS — Z23 Encounter for immunization: Secondary | ICD-10-CM | POA: Diagnosis not present

## 2016-07-12 LAB — URINALYSIS, ROUTINE W REFLEX MICROSCOPIC
Bilirubin Urine: NEGATIVE
Ketones, ur: 15 — AB
Leukocytes, UA: NEGATIVE
Nitrite: NEGATIVE
PH: 6 (ref 5.0–8.0)
SPECIFIC GRAVITY, URINE: 1.01 (ref 1.000–1.030)
TOTAL PROTEIN, URINE-UPE24: NEGATIVE
Urine Glucose: NEGATIVE
Urobilinogen, UA: 0.2 (ref 0.0–1.0)

## 2016-07-12 LAB — BASIC METABOLIC PANEL
BUN: 16 mg/dL (ref 6–23)
CO2: 29 meq/L (ref 19–32)
CREATININE: 1.07 mg/dL (ref 0.40–1.50)
Calcium: 10.2 mg/dL (ref 8.4–10.5)
Chloride: 102 mEq/L (ref 96–112)
GFR: 81.68 mL/min (ref >60.00)
Glucose, Bld: 84 mg/dL (ref 70–99)
Potassium: 3.9 mEq/L (ref 3.5–5.1)
Sodium: 139 mEq/L (ref 135–145)

## 2016-07-12 LAB — LIPID PANEL
Cholesterol: 196 mg/dL (ref 0–200)
HDL: 63.8 mg/dL (ref >39.00)
LDL CALC: 122 mg/dL — AB (ref 0–99)
NONHDL: 132.08
Total CHOL/HDL Ratio: 3
Triglycerides: 49 mg/dL (ref 0.0–149.0)
VLDL: 9.8 mg/dL (ref 0.0–40.0)

## 2016-07-12 LAB — CBC WITH DIFFERENTIAL/PLATELET
BASOS ABS: 0 10*3/uL (ref 0.0–0.1)
Basophils Relative: 0.8 % (ref 0.0–3.0)
EOS ABS: 0.1 10*3/uL (ref 0.0–0.7)
Eosinophils Relative: 2 % (ref 0.0–5.0)
HEMATOCRIT: 44 % (ref 39.0–52.0)
Hemoglobin: 14.8 g/dL (ref 13.0–17.0)
LYMPHS PCT: 45.9 % (ref 12.0–46.0)
Lymphs Abs: 2.8 10*3/uL (ref 0.7–4.0)
MCHC: 33.5 g/dL (ref 30.0–36.0)
MCV: 90 fl (ref 78.0–100.0)
Monocytes Absolute: 0.7 10*3/uL (ref 0.1–1.0)
Monocytes Relative: 11.9 % (ref 3.0–12.0)
NEUTROS ABS: 2.4 10*3/uL (ref 1.4–7.7)
NEUTROS PCT: 39.4 % — AB (ref 43.0–77.0)
PLATELETS: 270 10*3/uL (ref 150.0–400.0)
RBC: 4.89 Mil/uL (ref 4.22–5.81)
RDW: 13 % (ref 11.5–15.5)
WBC: 6.1 10*3/uL (ref 4.0–10.5)

## 2016-07-12 LAB — HEPATIC FUNCTION PANEL
ALK PHOS: 45 U/L (ref 39–117)
ALT: 23 U/L (ref 0–53)
AST: 24 U/L (ref 0–37)
Albumin: 4.5 g/dL (ref 3.5–5.2)
BILIRUBIN DIRECT: 0.3 mg/dL (ref 0.0–0.3)
BILIRUBIN TOTAL: 2 mg/dL — AB (ref 0.2–1.2)
TOTAL PROTEIN: 7.6 g/dL (ref 6.0–8.3)

## 2016-07-12 LAB — TSH: TSH: 1.18 u[IU]/mL (ref 0.35–4.50)

## 2016-07-12 NOTE — Progress Notes (Signed)
Pre visit review using our clinic review tool, if applicable. No additional management support is needed unless otherwise documented below in the visit note. Dermatology

## 2016-07-12 NOTE — Progress Notes (Signed)
Subjective:    Patient ID: Kelly Bush, male    DOB: [birth year], 41 y.o.   MRN: [id number]  HPI  Patient presents today for complete physical.  Immunizations: tetanus due. Diet: good during the week and bad during the weekends Exercise: started exercising (soccer) twice a week, gym, personal trainer  Wt Readings from Last 3 Encounters:  07/12/16 174 lb 6.4 oz (79.1 kg)  08/09/15 171 lb 3.2 oz (77.7 kg)  07/14/15 171 lb (77.6 kg)   Genital warts- reports that he was seen in [place] by a dermatology who did cryotherapy of the genital warts.  He was told to follow up with dermatology in 6 weeks. Requesting dermatology referral.   Review of Systems  Constitutional: Negative for unexpected weight change.  HENT: Negative for hearing loss and rhinorrhea.        Mild uri symptoms  Eyes: Negative for visual disturbance.  Respiratory: Negative for cough.   Cardiovascular: Negative for leg swelling.  Gastrointestinal: Negative for constipation and diarrhea.  Genitourinary: Negative for dysuria and frequency.  Musculoskeletal: Positive for back pain. Negative for arthralgias and myalgias.  Skin: Negative for rash.  Neurological: Negative for headaches.  Hematological: Negative for adenopathy.  Psychiatric/Behavioral:       Denies depression/anxiety       Past Medical History:  Diagnosis Date  . Genital warts      Social History   Social History  . Marital status: Married    Spouse name: N/A  . Number of children: 0  . Years of education: N/A   Occupational History  .  [employer]   Social History Main Topics  . Smoking status: Never Smoker  . Smokeless tobacco: Never Used  . Alcohol use 0.6 oz/week    1 Standard drinks or equivalent per week     Comment: once every 1 - 2 weeks  . Drug use: Unknown  . Sexual activity: Not on file   Other Topics Concern  . 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   Bachelors degree   No children   Soccer, movies, sports          Past Surgical History:  Procedure Laterality Date  . FRACTURE SURGERY  09/2009   right leg fx, has rod in leg  . LEG SURGERY  2001   left leg surgery ?osteoma?    Family History  Problem Relation Age of Onset  . Hypertension Mother   . Diabetes Paternal Aunt   . Cancer Maternal Grandmother     breast  . Hyperlipidemia Brother     No Known Allergies  No current outpatient prescriptions on file prior to visit.   No current facility-administered medications on file prior to visit.     BP 116/76 (BP Location: Right Arm, Patient Position: Sitting)   Pulse (!) 51   Temp 97.8 F (36.6 C) (Oral)   Resp 16   Ht 5\' 11"  (1.803 m)   Wt 174 lb 6.4 oz (79.1 kg)   SpO2 100%   BMI 24.32 kg/m    Objective:   Physical Exam  Physical Exam  Constitutional: He is oriented to person, place, and time. He appears well-developed and well-nourished. No distress.  HENT:  Head: Normocephalic and atraumatic.  Right Ear: Tympanic membrane and ear canal normal.  Left Ear: Tympanic membrane and  ear canal normal.  Mouth/Throat: Oropharynx is clear and moist.  Eyes: Pupils are equal, round, and reactive to light. No scleral icterus.  Neck: Normal range of motion. No thyromegaly present.  Cardiovascular: Normal rate and regular rhythm.   No murmur heard. Pulmonary/Chest: Effort normal and breath sounds normal. No respiratory distress. He has no wheezes. He has no rales. He exhibits no tenderness.  Abdominal: Soft. Bowel sounds are normal. He exhibits no distension and no mass. There is no tenderness. There is no rebound and no guarding.  Musculoskeletal: He exhibits no edema.  Lymphadenopathy:    He has no cervical adenopathy.  Neurological: He is alert and oriented to person, place, and time. He has normal patellar reflexes. He exhibits normal muscle  tone. Coordination normal.  Skin: Skin is warm and dry.  Psychiatric: He has a normal mood and affect. His behavior is normal. Judgment and thought content normal.  GU: deferred        Assessment & Plan:  HPV/genital warts- will refer to dermatology.  Preventative care- encouraged him to continue healthy diet, exercise. Will obtain routine lab work Td today.       Assessment & Plan:

## 2016-07-12 NOTE — Patient Instructions (Signed)
Please complete lab work prior to leaving. Work on [employer] diet.  Continue exercise. You will be contacted about your referral to dermatology.

## 2016-07-14 ENCOUNTER — Encounter: Payer: Self-pay | Admitting: Family

## 2016-07-14 HISTORY — DX: Gilbert syndrome: E80.4

## 2017-05-17 ENCOUNTER — Ambulatory Visit (INDEPENDENT_AMBULATORY_CARE_PROVIDER_SITE_OTHER): Payer: PRIVATE HEALTH INSURANCE | Admitting: Family

## 2017-05-17 ENCOUNTER — Encounter: Payer: Self-pay | Admitting: Family

## 2017-05-17 DIAGNOSIS — Z0000 Encounter for general adult medical examination without abnormal findings: Secondary | ICD-10-CM

## 2017-05-17 DIAGNOSIS — Z23 Encounter for immunization: Secondary | ICD-10-CM | POA: Diagnosis not present

## 2017-05-17 NOTE — Progress Notes (Signed)
Patient is a [age] year old male who presents today for complete physical.  Upon chart review it appears that his physical was completed on March 21 of 2018.  Advised patient that it is too soon to repeat his physical for insurance purposes.  He wishes to wait until insurance will cover.  Flu shot was given today.

## 2017-05-17 NOTE — Patient Instructions (Signed)
Please schedule a complete physical after 07/12/17.

## 2017-05-17 NOTE — Progress Notes (Deleted)
   Subjective:    Patient ID: Kelly Bush, male    DOB: [birth year], 42 y.o.   MRN: [id number]  HPI  Mr. Kelly Bush is a 42 yr old male who presents today for cpx.  Immunizations: Diet: Exercise: Vision: Dental:  Wt Readings from Last 3 Encounters:  05/17/17 161 lb 3.2 oz (73.1 kg)  07/12/16 174 lb 6.4 oz (79.1 kg)  08/09/15 171 lb 3.2 oz (77.7 kg)      Review of Systems     Past Medical History:  Diagnosis Date  . Genital warts   . Gilbert's syndrome 07/14/2016     Social History   Socioeconomic History  . Marital status: Significant Other    Spouse name: Not on file  . Number of children: 0  . Years of education: Not on file  . Highest education level: Not on file  Social Needs  . Financial resource strain: Not on file  . Food insecurity - worry: Not on file  . Food insecurity - inability: Not on file  . Transportation needs - medical: Not on file  . Transportation needs - non-medical: Not on file  Occupational History    Employer: [employer]  Tobacco Use  . Smoking status: Never Smoker  . Smokeless tobacco: Never Used  Substance and Sexual Activity  . Alcohol use: Yes    Alcohol/week: 0.6 oz    Types: 1 Standard drinks or equivalent per week    Comment: once every 1 - 2 weeks  . Drug use: No  . Sexual activity: Yes    Partners: Female  Other Topics Concern  . 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   Bachelors degree   No children   Soccer, movies, sports       Past Surgical History:  Procedure Laterality Date  . FRACTURE SURGERY  09/2009   right leg fx, has rod in leg  . LEG SURGERY  2001   left leg surgery ?osteoma?    Family History  Problem Relation Age of Onset  . Hypertension Mother   . Diabetes Paternal Aunt   . Cancer Maternal Grandmother        breast  . Hyperlipidemia Brother     No Known  Allergies  No current outpatient medications on file prior to visit.   No current facility-administered medications on file prior to visit.     BP 115/68 (BP Location: Right Arm, Patient Position: Sitting, Cuff Size: Small)   Pulse 63   Temp 98.1 F (36.7 C) (Oral)   Resp 16   Ht 5' 10.5" (1.791 m)   Wt 161 lb 3.2 oz (73.1 kg)   SpO2 99%   BMI 22.80 kg/m    Objective:   Physical Exam        Assessment & Plan:

## 2017-07-16 ENCOUNTER — Ambulatory Visit (INDEPENDENT_AMBULATORY_CARE_PROVIDER_SITE_OTHER): Payer: PRIVATE HEALTH INSURANCE | Admitting: Family

## 2017-07-16 ENCOUNTER — Encounter: Payer: Self-pay | Admitting: Family

## 2017-07-16 VITALS — BP 108/80 | HR 76 | Temp 98.1°F | Resp 16 | Ht 70.5 in | Wt 163.0 lb

## 2017-07-16 DIAGNOSIS — Z0000 Encounter for general adult medical examination without abnormal findings: Secondary | ICD-10-CM | POA: Diagnosis not present

## 2017-07-16 LAB — CBC WITH DIFFERENTIAL/PLATELET
Basophils Absolute: 0 10*3/uL (ref 0.0–0.1)
Basophils Relative: 0.7 % (ref 0.0–3.0)
EOS ABS: 0.1 10*3/uL (ref 0.0–0.7)
Eosinophils Relative: 2.1 % (ref 0.0–5.0)
HCT: 41.9 % (ref 39.0–52.0)
Hemoglobin: 14.3 g/dL (ref 13.0–17.0)
LYMPHS ABS: 2.6 10*3/uL (ref 0.7–4.0)
Lymphocytes Relative: 43 % (ref 12.0–46.0)
MCHC: 34.1 g/dL (ref 30.0–36.0)
MCV: 89.3 fl (ref 78.0–100.0)
MONO ABS: 0.6 10*3/uL (ref 0.1–1.0)
Monocytes Relative: 10.4 % (ref 3.0–12.0)
NEUTROS ABS: 2.7 10*3/uL (ref 1.4–7.7)
Neutrophils Relative %: 43.8 % (ref 43.0–77.0)
PLATELETS: 233 10*3/uL (ref 150.0–400.0)
RBC: 4.7 Mil/uL (ref 4.22–5.81)
RDW: 13.3 % (ref 11.5–15.5)
WBC: 6.1 10*3/uL (ref 4.0–10.5)

## 2017-07-16 LAB — LIPID PANEL
CHOLESTEROL: 181 mg/dL (ref 0–200)
HDL: 67.1 mg/dL (ref >39.00)
LDL CALC: 104 mg/dL — AB (ref 0–99)
NonHDL: 113.79
TRIGLYCERIDES: 50 mg/dL (ref 0.0–149.0)
Total CHOL/HDL Ratio: 3
VLDL: 10 mg/dL (ref 0.0–40.0)

## 2017-07-16 LAB — BASIC METABOLIC PANEL
BUN: 17 mg/dL (ref 6–23)
CO2: 29 meq/L (ref 19–32)
CREATININE: 1.09 mg/dL (ref 0.40–1.50)
Calcium: 9.7 mg/dL (ref 8.4–10.5)
Chloride: 102 mEq/L (ref 96–112)
GFR: 79.54 mL/min (ref >60.00)
GLUCOSE: 86 mg/dL (ref 70–99)
Potassium: 4 mEq/L (ref 3.5–5.1)
Sodium: 140 mEq/L (ref 135–145)

## 2017-07-16 LAB — HEPATIC FUNCTION PANEL
ALT: 14 U/L (ref 0–53)
AST: 23 U/L (ref 0–37)
Albumin: 4.2 g/dL (ref 3.5–5.2)
Alkaline Phosphatase: 40 U/L (ref 39–117)
BILIRUBIN DIRECT: 0.3 mg/dL (ref 0.0–0.3)
BILIRUBIN TOTAL: 2.1 mg/dL — AB (ref 0.2–1.2)
Total Protein: 7.5 g/dL (ref 6.0–8.3)

## 2017-07-16 LAB — TSH: TSH: 1.18 u[IU]/mL (ref 0.35–4.50)

## 2017-07-16 NOTE — Progress Notes (Signed)
Subjective:    Patient ID: Kelly Bush, male    DOB: Mar 07, 1976, 42 y.o.   MRN: [id number]  HPI  Patient is a 42 yr old male who presents today for cpx.  Immunizations:  Td 07/12/16 Diet:  improving his diet Exercise: soccer, yoga (yoga helps his back) Vision: unsure Dental: up to date Wt Readings from Last 3 Encounters:  07/16/17 163 lb (73.9 kg)  05/17/17 161 lb 3.2 oz (73.1 kg)  07/12/16 174 lb 6.4 oz (79.1 kg)       Review of Systems  Constitutional: Negative for unexpected weight change.  HENT: Negative for hearing loss and rhinorrhea.   Eyes: Negative for visual disturbance.  Respiratory: Negative for cough.   Cardiovascular: Negative for leg swelling.  Gastrointestinal: Negative for blood in stool, constipation and diarrhea.  Genitourinary: Negative for dysuria, frequency and hematuria.  Musculoskeletal: Positive for back pain.  Skin: Negative for rash.  Neurological: Negative for headaches.  Hematological: Negative for adenopathy.  Psychiatric/Behavioral:       Denies depression/anxiety   Past Medical History:  Diagnosis Date  . Genital warts   . Gilbert's syndrome 07/14/2016     Social History   Socioeconomic History  . Marital status: Significant Other    Spouse name: Not on file  . Number of children: 0  . Years of education: Not on file  . Highest education level: Not on file  Occupational History    Employer: [employer]  Social Needs  . Financial resource strain: Not on file  . Food insecurity:    Worry: Not on file    Inability: Not on file  . Transportation needs:    Medical: Not on file    Non-medical: Not on file  Tobacco Use  . Smoking status: Never Smoker  . Smokeless tobacco: Never Used  Substance and Sexual Activity  . Alcohol use: Yes    Alcohol/week: 0.6 oz    Types: 1 Standard drinks or equivalent per week    Comment: once every 1 - 2 weeks  . Drug use: No  . Sexual activity: Yes    Partners: Female  Lifestyle  .  Physical activity:    Days per week: Not on file    Minutes per session: Not on file  . Stress: Not on file  Relationships  . Social connections:    Talks on phone: Not on file    Gets together: Not on file    Attends religious service: Not on file    Active member of club or organization: Not on file    Attends meetings of clubs or organizations: Not on file    Relationship status: Not on file  . Intimate partner violence:    Fear of current or ex partner: Not on file    Emotionally abused: Not on file    Physically abused: Not on file    Forced sexual activity: Not on file  Other Topics Concern  . 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   Bachelors degree   No children   Soccer, movies, sports       Past Surgical History:  Procedure Laterality Date  . FRACTURE SURGERY  09/2009   right leg fx, has rod in leg  . LEG SURGERY  2001   left leg surgery ?osteoma?    Family History  Problem Relation Age of Onset  . Hypertension Mother   . Diabetes Paternal Aunt   . Cancer Maternal Grandmother        breast  . Hyperlipidemia Brother     No Known Allergies  No current outpatient medications on file prior to visit.   No current facility-administered medications on file prior to visit.     BP 108/80 (BP Location: Right Arm, Patient Position: Sitting, Cuff Size: Small)   Pulse 76   Temp 98.1 F (36.7 C) (Oral)   Resp 16   Ht 5' 10.5" (1.791 m)   Wt 163 lb (73.9 kg)   SpO2 100%   BMI 23.06 kg/m       Objective:   Physical Exam  Physical Exam  Constitutional: He is oriented to person, place, and time. He appears well-developed and well-nourished. No distress.  HENT:  Head: Normocephalic and atraumatic.  Right Ear: Tympanic membrane and ear canal normal.  Left Ear: Tympanic membrane and ear canal normal.  Mouth/Throat: Oropharynx  is clear and moist.  Eyes: Pupils are equal, round, and reactive to light. No scleral icterus.  Neck: Normal range of motion. No thyromegaly present.  Cardiovascular: Normal rate and regular rhythm.   No murmur heard. Pulmonary/Chest: Effort normal and breath sounds normal. No respiratory distress. He has no wheezes. He has no rales. He exhibits no tenderness.  Abdominal: Soft. Bowel sounds are normal. He exhibits no distension and no mass. There is no tenderness. There is no rebound and no guarding.  Musculoskeletal: He exhibits no edema.  Lymphadenopathy:    He has no cervical adenopathy.  Neurological: He is alert and oriented to person, place, and time. He has normal patellar reflexes. He exhibits normal muscle tone. Coordination normal.  Skin: Skin is warm and dry.  Psychiatric: He has a normal mood and affect. His behavior is normal. Judgment and thought content normal.           Assessment & Plan:   Preventative care- immunizations reviewed and up to date. Weight is healthy. Advised pt to continue regular exercise, weight maintenance and healthy diet. Advised him to schedule routine eye exam.       Assessment & Plan:

## 2017-07-16 NOTE — Patient Instructions (Addendum)
Please schedule a routine eye exam.   Continue healthy diet and regular exercise.  

## 2017-07-23 NOTE — Addendum Note (Signed)
Addended by: Harley AltoPRICE, Junella Domke M on: 07/23/2017 04:08 PM   Modules accepted: Orders

## 2017-11-20 IMAGING — MR MR LUMBAR SPINE W/O CM
5 series · 43 of 48 positions shown · non-contrast
Comparison: None.

CLINICAL DATA: Right buttock pain radiating down the right leg.

EXAM:
MRI LUMBAR SPINE WITHOUT CONTRAST
TECHNIQUE: Multiplanar, multisequence MR imaging of the lumbar spine was
performed. No intravenous contrast was administered.

[Series 3: tirm sag · sagittal · 4.0mm · 0.55mm/px · 6 of 13 slices shown]
[im 1/13]
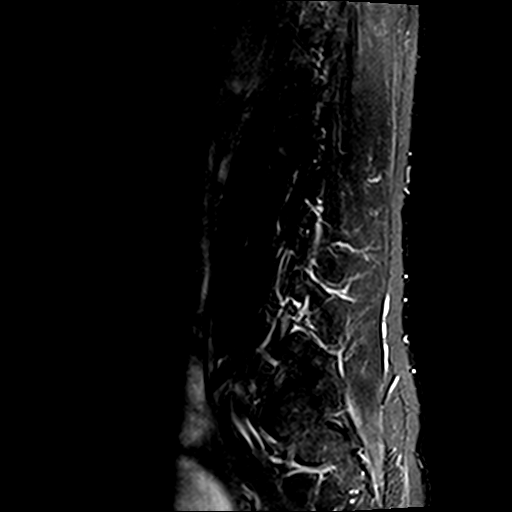
[im 3/13]
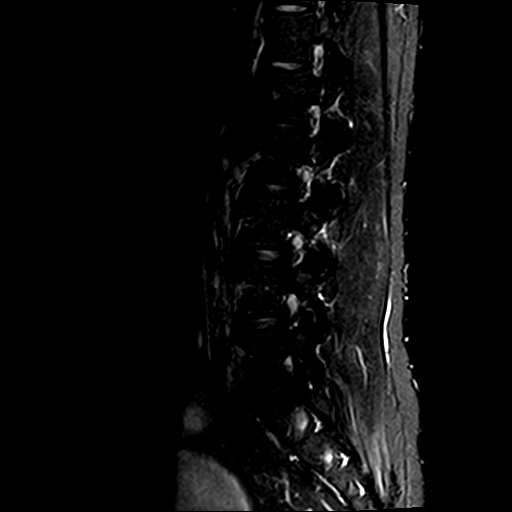
[im 5/13]
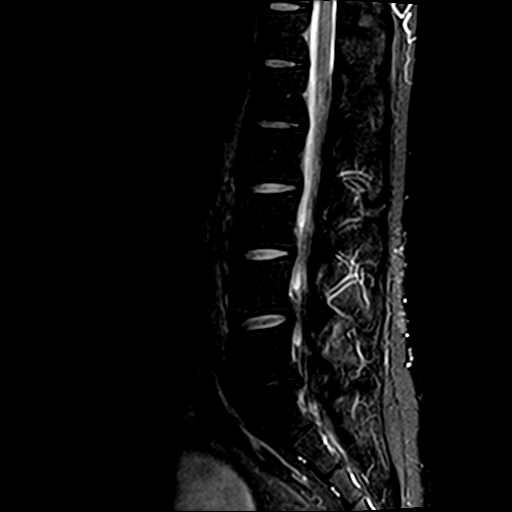
[im 8/13]
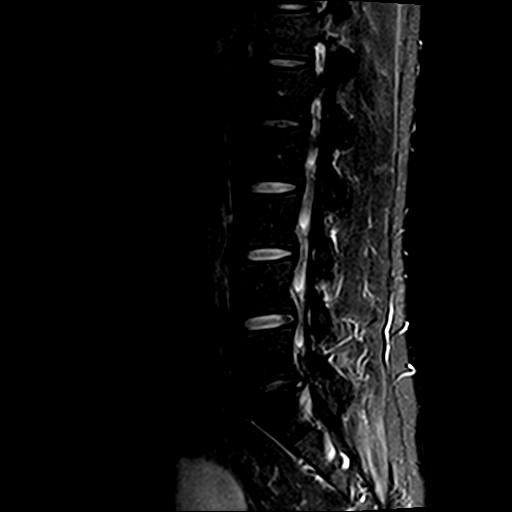
[im 10/13]
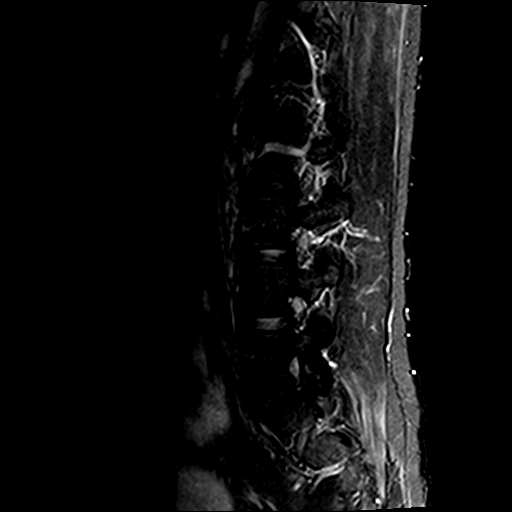
[im 13/13]
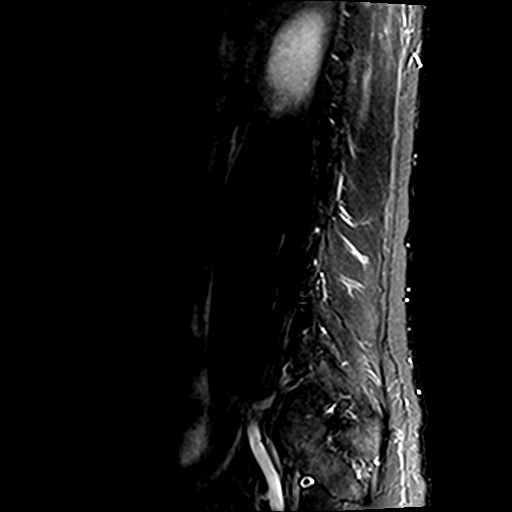

[Series 4: T2 · sagittal · 4.0mm · 0.88mm/px · 6 of 13 slices shown (1 of 2)]
[im 1/13]
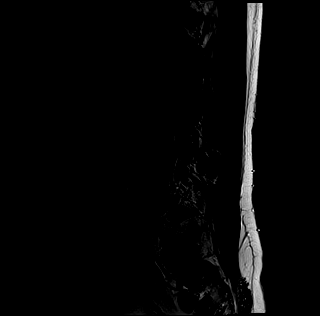
[im 3/13]
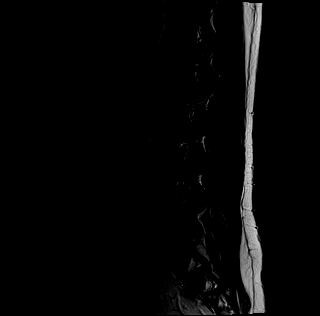
[im 5/13]
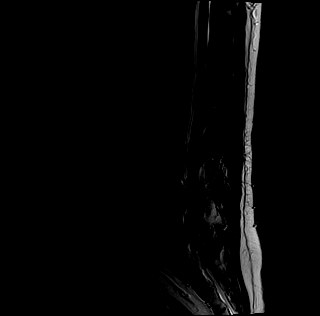
[im 8/13]
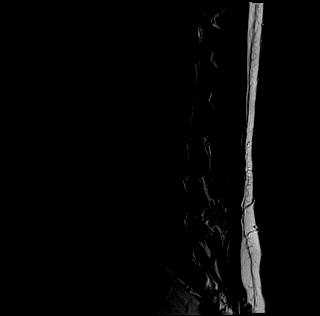
[im 10/13]
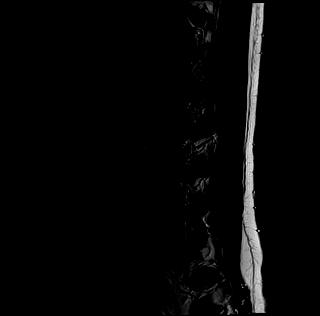
[im 13/13]
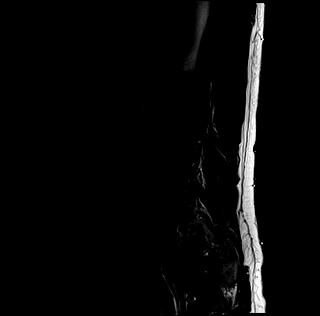

[Series 5: T1 · sagittal · 4.0mm · 0.88mm/px · 6 of 13 slices shown (1 of 2)]
[im 1/13]
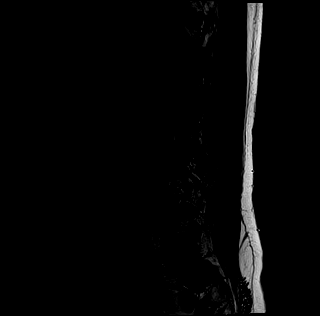
[im 3/13]
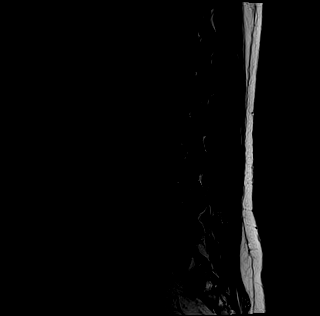
[im 5/13]
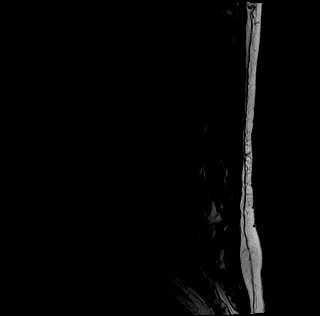
[im 8/13]
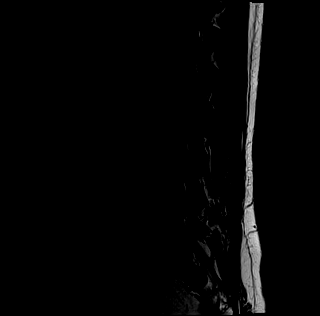
[im 10/13]
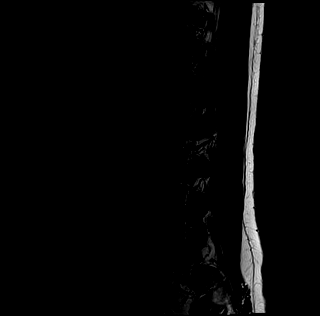
[im 13/13]
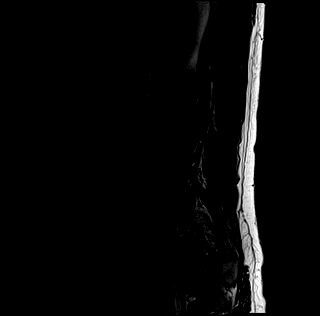

[Series 7: T2 · axial · 4.0mm · 0.70mm/px · z∈[-45,+132]mm · 15 of 33 slices shown (2 of 2)]
[im 1/33]
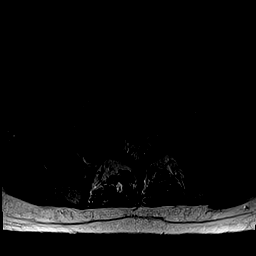
[im 3/33]
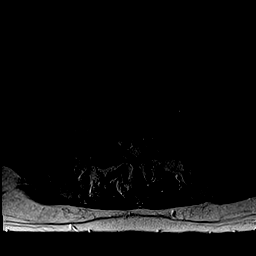
[im 5/33]
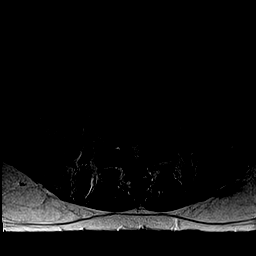
[im 7/33]
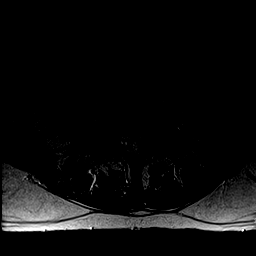
[im 10/33]
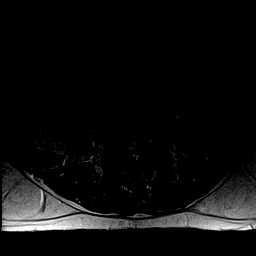
[im 12/33]
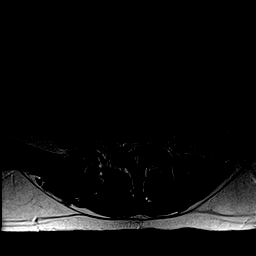
[im 14/33]
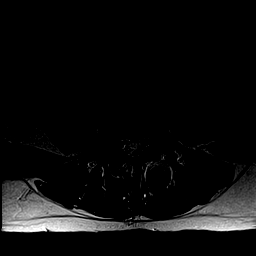
[im 17/33]
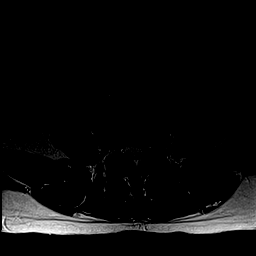
[im 19/33]
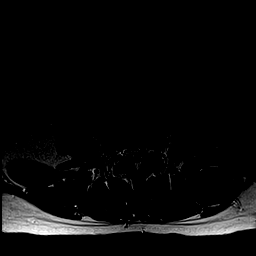
[im 21/33]
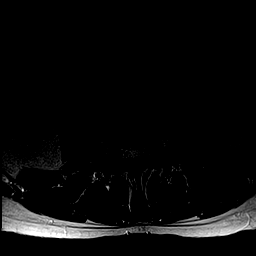
[im 23/33]
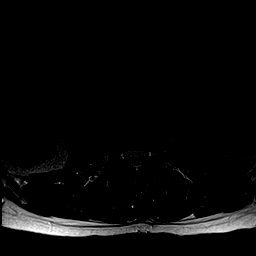
[im 26/33]
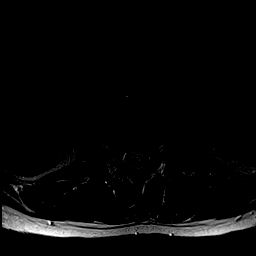
[im 28/33]
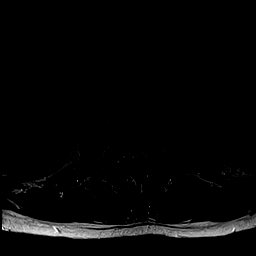
[im 30/33]
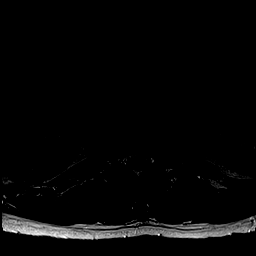
[im 33/33]
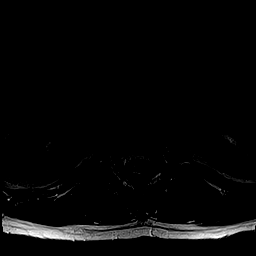

[Series 8: T1 · axial · 4.0mm · 0.70mm/px · z∈[-45,+132]mm · 10 of 33 slices shown (2 of 2)]
[im 1/33]
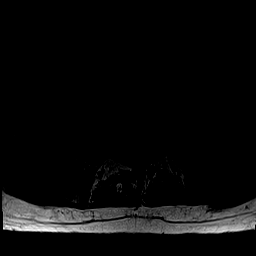
[im 3/33]
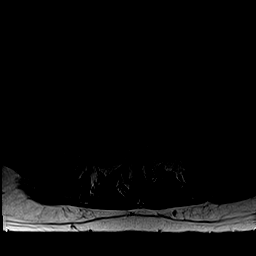
[im 5/33]
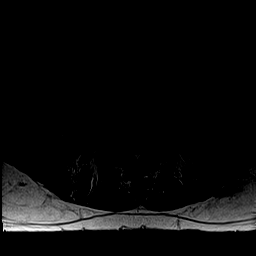
[im 10/33]
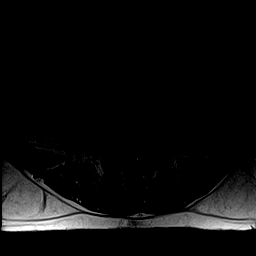
[im 14/33]
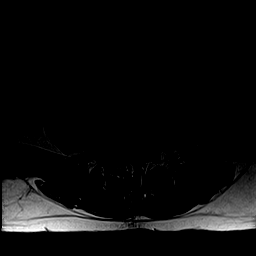
[im 17/33]
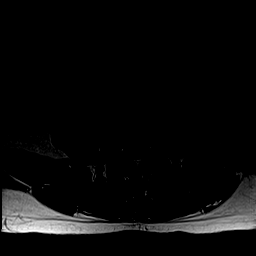
[im 19/33]
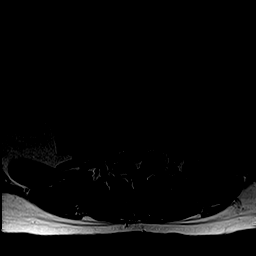
[im 23/33]
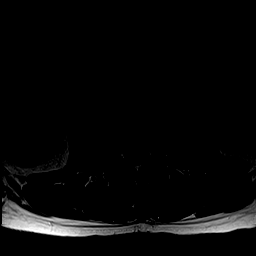
[im 28/33]
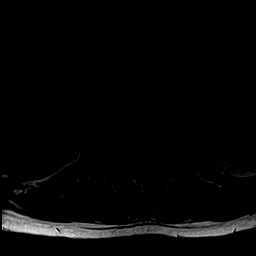
[im 33/33]
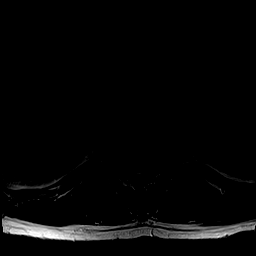

[43 of 48 positions shown; findings below may reference images not displayed]

FINDINGS: Segmentation: Conventional anatomy assistant, with the last open
disc space designated L5-S1.

Alignment: The vertebral bodies of the lumbar spine are normal in
alignment.

Bones: The vertebral bodies of the lumbar spine are normal in size.
There is normal bone marrow signal demonstrated throughout the
vertebra. The visualized portions of the SI joints are unremarkable.

Conus medullaris: Extends to the L1 level and appears normal. The
nerve roots of the cauda equina and the filum terminale are normal.

Paraspinal and other soft tissues: There is no focal abnormality.
The imaged intra-abdominal contents are unremarkable.

Disc levels:

Disc spaces: Degenerative disc disease with mild disc height loss at
L5-S1.

T12-L1: No significant disc bulge. No evidence of neural foraminal
stenosis. No central canal stenosis.

L1-L2: No significant disc bulge. No evidence of neural foraminal
stenosis. No central canal stenosis.

L2-L3: No significant disc bulge. No evidence of neural foraminal
stenosis. No central canal stenosis.

L3-L4: No significant disc bulge. No evidence of neural foraminal
stenosis. No central canal stenosis. Mild bilateral facet
arthropathy.

L4-L5: No significant disc bulge. No evidence of neural foraminal
stenosis. No central canal stenosis. Mild bilateral facet
arthropathy.

L5-S1: Large broad right paracentral disc protrusion with mass
effect on the right S1 nerve root. Left lateral recess stenosis. No
evidence of neural foraminal stenosis. No central canal stenosis.
Mild bilateral facet arthropathy.
IMPRESSION: 1. At L5-S1 there is a large broad right paracentral disc protrusion
with mass effect on the right S1 nerve root. Left lateral recess
stenosis.

## 2018-05-01 ENCOUNTER — Ambulatory Visit: Payer: PRIVATE HEALTH INSURANCE | Admitting: Family Medicine

## 2018-05-01 ENCOUNTER — Encounter: Payer: Self-pay | Admitting: Family Medicine

## 2018-05-01 VITALS — BP 135/87 | HR 77 | Ht 70.0 in | Wt 161.0 lb

## 2018-05-01 DIAGNOSIS — M79671 Pain in right foot: Secondary | ICD-10-CM | POA: Diagnosis not present

## 2018-05-01 NOTE — Patient Instructions (Signed)
Your exam and ultrasound are reassuring. This is consistent with a foot sprain with contusion. Icing 15 minutes at a time 3-4 times a day for the next week then as needed. Aleve 1-2 tabs twice a day with food for pain and inflammation for 7 days then as needed. Arch binder for support when up and walking around for next 6 weeks. Activities as tolerated - you should try jogging before running then soccer drills before returning to scrimmaging/games.  Use pain as your guide - you should not be limping and ideally pain less than 3 on a scale of 1-10 when doing these. These are frustrating as they can take several weeks to months to resolve but you're not doing damage to the area. Follow up with me as needed.

## 2018-05-01 NOTE — Progress Notes (Signed)
PCP: Sandford Craze, NP  Subjective:   HPI: Patient is a 43 y.o. male here for right foot pain.  Patient reports back in September he was stepped on when playing soccer. Had right foot pain, localized swelling, difficulty bending foot upwards. Mostly improved until October when moving furniture felt more pain in this area. He used crutches a couple days and pain resolved. Then accidentally kicked the ground when playing soccer in October. Had pain dorsal right foot since then at 3/10 level, more dull. At times needs to walk on heel due to pain. Only wore boot for a week and now wearing ASO. Had iced and took OTC meds initially with some benefit. No skin changes, numbness.  Past Medical History:  Diagnosis Date  . Genital warts   . Gilbert's syndrome 07/14/2016    No current outpatient medications on file prior to visit.   No current facility-administered medications on file prior to visit.     Past Surgical History:  Procedure Laterality Date  . FRACTURE SURGERY  09/2009   right leg fx, has rod in leg  . LEG SURGERY  2001   left leg surgery ?osteoma?    No Known Allergies  Social History   Socioeconomic History  . Marital status: Significant Other    Spouse name: Not on file  . Number of children: 0  . Years of education: Not on file  . Highest education level: Not on file  Occupational History    Employer: [employer]  Social Needs  . Financial resource strain: Not on file  . Food insecurity:    Worry: Not on file    Inability: Not on file  . Transportation needs:    Medical: Not on file    Non-medical: Not on file  Tobacco Use  . Smoking status: Never Smoker  . Smokeless tobacco: Never Used  Substance and Sexual Activity  . Alcohol use: Yes    Alcohol/week: 1.0 standard drinks    Types: 1 Standard drinks or equivalent per week    Comment: once every 1 - 2 weeks  . Drug use: No  . Sexual activity: Yes    Partners: Female  Lifestyle  . Physical  activity:    Days per week: Not on file    Minutes per session: Not on file  . Stress: Not on file  Relationships  . Social connections:    Talks on phone: Not on file    Gets together: Not on file    Attends religious service: Not on file    Active member of club or organization: Not on file    Attends meetings of clubs or organizations: Not on file    Relationship status: Not on file  . Intimate partner violence:    Fear of current or ex partner: Not on file    Emotionally abused: Not on file    Physically abused: Not on file    Forced sexual activity: Not on file  Other Topics Concern  . 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   Bachelors degree   No children   Soccer, movies, sports   No pets    Family History  Problem Relation Age of Onset  . Hypertension Mother   . Diabetes Paternal Aunt   . Cancer Maternal Grandmother        breast  .  Hyperlipidemia Brother     BP 135/87   Pulse 77   Ht 5\' 10"  (1.778 m)   Wt 161 lb (73 kg)   BMI 23.10 kg/m   Review of Systems: See HPI above.     Objective:  Physical Exam:  Gen: NAD, comfortable in exam room  Right foot/ankle: No gross deformity, swelling, ecchymoses FROM with 5/5 strength all directions TTP minimally 2nd, 3rd metatarsals. Negative ant drawer and talar tilt.   Negative syndesmotic compression. Negative metatarsal squeeze. Thompsons test negative. NV intact distally.  Left foot/ankle: No deformity. FROM with 5/5 strength. No tenderness to palpation. NVI distally.   MSK u/s:  No cortical irregularities of right foot metatarsals, edema overlying cortices.  Assessment & Plan:  1. Right foot injury - independently reviewed radiographs.  Performed and reviewed ultrasound - no evidence fracture or healed fracture.  Reassured patient - 2/2 sprain with contusion.  Icing,  aleve.  Arch binder.  Activities as tolerated and reviewed how to advance these.  F/u prn.

## 2018-06-12 ENCOUNTER — Ambulatory Visit (INDEPENDENT_AMBULATORY_CARE_PROVIDER_SITE_OTHER): Payer: PRIVATE HEALTH INSURANCE

## 2018-06-12 ENCOUNTER — Encounter: Payer: Self-pay | Admitting: Family

## 2018-06-12 DIAGNOSIS — Z23 Encounter for immunization: Secondary | ICD-10-CM | POA: Diagnosis not present

## 2018-06-19 ENCOUNTER — Ambulatory Visit: Payer: PRIVATE HEALTH INSURANCE | Admitting: Family Medicine

## 2018-06-19 ENCOUNTER — Encounter: Payer: Self-pay | Admitting: Family Medicine

## 2018-06-19 VITALS — BP 115/75 | HR 74 | Ht 70.0 in | Wt 160.0 lb

## 2018-06-19 DIAGNOSIS — M79671 Pain in right foot: Secondary | ICD-10-CM | POA: Diagnosis not present

## 2018-06-19 NOTE — Progress Notes (Addendum)
PCP: Sandford Craze, NP  Subjective:   HPI: Patient is a 43 y.o. male here for right foot pain.  1/8: Patient reports back in September he was stepped on when playing soccer. Had right foot pain, localized swelling, difficulty bending foot upwards. Mostly improved until October when moving furniture felt more pain in this area. He used crutches a couple days and pain resolved. Then accidentally kicked the ground when playing soccer in October. Had pain dorsal right foot since then at 3/10 level, more dull. At times needs to walk on heel due to pain. Only wore boot for a week and now wearing ASO. Had iced and took OTC meds initially with some benefit. No skin changes, numbness.  2/26: Patient reports he feels about the same compared to last visit. He had only mild improvement since last visit then put a lot of pressure on it one day and pain in mid right foot dorsally and anterior right ankle. Pain minimal if he walks on heel at 0-1/10 level. Pain up to 6/10 and sharp if walks on mid-forefoot. Wearing arch binder. No skin changes, numbness.  Past Medical History:  Diagnosis Date  . Genital warts   . Gilbert's syndrome 07/14/2016    No current outpatient medications on file prior to visit.   No current facility-administered medications on file prior to visit.     Past Surgical History:  Procedure Laterality Date  . FRACTURE SURGERY  09/2009   right leg fx, has rod in leg  . LEG SURGERY  2001   left leg surgery ?osteoma?    No Known Allergies  Social History   Socioeconomic History  . Marital status: Single    Spouse name: Not on file  . Number of children: 0  . Years of education: Not on file  . Highest education level: Not on file  Occupational History    Employer: [employer]  Social Needs  . Financial resource strain: Not on file  . Food insecurity:    Worry: Not on file    Inability: Not on file  . Transportation needs:    Medical: Not on file   Non-medical: Not on file  Tobacco Use  . Smoking status: Never Smoker  . Smokeless tobacco: Never Used  Substance and Sexual Activity  . Alcohol use: Yes    Alcohol/week: 1.0 standard drinks    Types: 1 Standard drinks or equivalent per week    Comment: once every 1 - 2 weeks  . Drug use: No  . Sexual activity: Yes    Partners: Female  Lifestyle  . Physical activity:    Days per week: Not on file    Minutes per session: Not on file  . Stress: Not on file  Relationships  . Social connections:    Talks on phone: Not on file    Gets together: Not on file    Attends religious service: Not on file    Active member of club or organization: Not on file    Attends meetings of clubs or organizations: Not on file    Relationship status: Not on file  . Intimate partner violence:    Fear of current or ex partner: Not on file    Emotionally abused: Not on file    Physically abused: Not on file    Forced sexual activity: Not on file  Other Topics Concern  . 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   Bachelors degree   No children   Soccer, movies, sports   No pets    Family History  Problem Relation Age of Onset  . Hypertension Mother   . Diabetes Paternal Aunt   . Cancer Maternal Grandmother        breast  . Hyperlipidemia Brother     BP 115/75   Pulse 74   Ht 5\' 10"  (1.778 m)   Wt 160 lb (72.6 kg)   BMI 22.96 kg/m   Review of Systems: See HPI above.     Objective:  Physical Exam:  Gen: NAD, comfortable in exam room  Right foot/ankle: No gross deformity, swelling, ecchymoses FROM with 5/5 strength all directions of ankle and digits. No TTP currently. Negative ant drawer and talar tilt.   Negative syndesmotic compression. Thompsons test negative. NV intact distally.  MSK u/s: No cortical irregularities of metatarsals of right  foot.  Extensor, medial and lateral ankle tendons intact without abnormalities.  Assessment & Plan:  1. Right foot injury - radiographs negative.  Ultrasound reassuring.  Patient with fairly severe persistent pain primarily of midfoot.  Started previously when he was stepped on though a couple injuries since that time.  Advised at this time we go ahead with MRI to further assess of the foot for possible lisfranc, other injuries to account for his persistent pain.  Arch binder, icing, aleve if needed in meantime.  Addendum:  MRI reviewed and discussed with patient.  No abnormalities - reassured, may have residual low level sprain.  Encouraged cam walker and wearing this until pain is resolved over next 2-6 weeks and switch to supportive shoe.  F/u in 6 weeks or as needed.

## 2018-06-19 NOTE — Patient Instructions (Signed)
We will go ahead with an MRI of your foot to further assess for possible lis franc injury. I will call you with results and next steps.

## 2018-06-20 NOTE — Addendum Note (Signed)
Addended by: Kathi Simpers F on: 06/20/2018 11:46 AM   Modules accepted: Orders

## 2018-06-21 ENCOUNTER — Encounter: Payer: Self-pay | Admitting: *Deleted

## 2018-07-04 ENCOUNTER — Encounter: Payer: Self-pay | Admitting: Family Medicine

## 2018-10-09 ENCOUNTER — Telehealth: Payer: Self-pay | Admitting: Family

## 2018-10-09 NOTE — Telephone Encounter (Signed)
Spoke with pt to schedule pt cpe but there is no availability in June for cpe and pt stated will call at the end of June to see providers schedule for July.

## 2018-12-25 ENCOUNTER — Encounter: Payer: PRIVATE HEALTH INSURANCE | Admitting: Family

## 2018-12-31 ENCOUNTER — Ambulatory Visit (INDEPENDENT_AMBULATORY_CARE_PROVIDER_SITE_OTHER): Payer: PRIVATE HEALTH INSURANCE | Admitting: Family

## 2018-12-31 ENCOUNTER — Encounter: Payer: Self-pay | Admitting: Family

## 2018-12-31 ENCOUNTER — Other Ambulatory Visit: Payer: Self-pay

## 2018-12-31 VITALS — BP 109/56 | HR 54 | Temp 97.9°F | Resp 12 | Ht 70.5 in | Wt 165.2 lb

## 2018-12-31 DIAGNOSIS — Z0000 Encounter for general adult medical examination without abnormal findings: Secondary | ICD-10-CM | POA: Diagnosis not present

## 2018-12-31 DIAGNOSIS — Z23 Encounter for immunization: Secondary | ICD-10-CM | POA: Diagnosis not present

## 2018-12-31 NOTE — Progress Notes (Signed)
Subjective:    Patient ID: Kelly Bush, male    DOB: 1975-06-15, 43 y.o.   MRN: [id number]  HPI  Patient presents today for complete physical.  Immunizations: flu shot today, tetanus 2018 Diet: reports health diet Wt Readings from Last 3 Encounters:  12/31/18 165 lb 3.2 oz (74.9 kg)  06/19/18 160 lb (72.6 kg)  05/01/18 161 lb (73 kg)  Exercise:    Reports that he had issues back in October with right foot pain.  Saw Dr. Barbaraann Barthel.  He is playing soccer once a week Dental: went last year, plans to schedule Vision: had exam 2 weeks ago   Review of Systems  Constitutional: Negative for unexpected weight change.  HENT: Negative for hearing loss and rhinorrhea.   Eyes: Negative for visual disturbance.  Respiratory: Negative for cough and shortness of breath.   Cardiovascular: Negative for chest pain and leg swelling.  Gastrointestinal: Negative for blood in stool, constipation and diarrhea.  Genitourinary: Negative for dysuria, frequency and hematuria.  Musculoskeletal: Positive for back pain (occasional back pain, mild).  Skin: Negative for rash.        Cryotherapy of genital wart ([place])   Neurological: Negative for headaches.  Hematological: Negative for adenopathy.  Psychiatric/Behavioral:       Denies anxiety   Past Medical History:  Diagnosis Date  . Genital warts   . Gilbert's syndrome 07/14/2016     Social History   Socioeconomic History  . Marital status: Single    Spouse name: Not on file  . Number of children: 0  . Years of education: Not on file  . Highest education level: Not on file  Occupational History    Employer: [employer]  Social Needs  . Financial resource strain: Not on file  . Food insecurity    Worry: Not on file    Inability: Not on file  . Transportation needs    Medical: Not on file    Non-medical: Not on file  Tobacco Use  . Smoking status: Never Smoker  . Smokeless tobacco: Never Used  Substance and Sexual Activity   . Alcohol use: Yes    Alcohol/week: 1.0 standard drinks    Types: 1 Standard drinks or equivalent per week    Comment: once every 1 - 2 weeks  . Drug use: No  . Sexual activity: Yes    Partners: Female  Lifestyle  . Physical activity    Days per week: Not on file    Minutes per session: Not on file  . Stress: Not on file  Relationships  . Social Herbalist on phone: Not on file    Gets together: Not on file    Attends religious service: Not on file    Active member of club or organization: Not on file    Attends meetings of clubs or organizations: Not on file    Relationship status: Not on file  . Intimate partner violence    Fear of current or ex partner: Not on file    Emotionally abused: Not on file    Physically abused: Not on file    Forced sexual activity: Not on file  Other Topics Concern  . 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   Bachelors degree   No children   Soccer, movies, sports  No pets    Past Surgical History:  Procedure Laterality Date  . FRACTURE SURGERY  09/2009   right leg fx, has rod in leg  . LEG SURGERY  2001   left leg surgery ?osteoma?    Family History  Problem Relation Age of Onset  . Hypertension Mother   . Diabetes Paternal Aunt   . Cancer Maternal Grandmother        breast  . Hyperlipidemia Brother     No Known Allergies  No current outpatient medications on file prior to visit.   No current facility-administered medications on file prior to visit.     BP (!) 109/56 (BP Location: Left Arm, Cuff Size: Normal)   Pulse (!) 54   Temp 97.9 F (36.6 C) (Temporal)   Resp 12   Ht 5' 10.5" (1.791 m)   Wt 165 lb 3.2 oz (74.9 kg)   SpO2 100%   BMI 23.37 kg/m        Objective:   Physical Exam  Physical Exam  Constitutional: He is oriented to person, place, and time. He appears  well-developed and well-nourished. No distress.  HENT:  Head: Normocephalic and atraumatic.  Right Ear: Tympanic membrane and ear canal normal.  Left Ear: Tympanic membrane and ear canal normal.  Mouth/Throat: not-examined.  Pt wearing mask for covid-19 precautions Eyes: Pupils are equal, round, and reactive to light. No scleral icterus.  Neck: Normal range of motion. No thyromegaly present.  Cardiovascular: Normal rate and regular rhythm.   No murmur heard. Pulmonary/Chest: Effort normal and breath sounds normal. No respiratory distress. He has no wheezes. He has no rales. He exhibits no tenderness.  Abdominal: Soft. Bowel sounds are normal. He exhibits no distension and no mass. There is no tenderness. There is no rebound and no guarding.  Musculoskeletal: He exhibits no edema.  Lymphadenopathy:    He has no cervical adenopathy.  Neurological: He is alert and oriented to person, place, and time. He exhibits normal muscle tone. Coordination normal.  Skin: Skin is warm and dry.  Psychiatric: He has a normal mood and affect. His behavior is normal. Judgment and thought content normal.           Assessment & Plan:    Preventative care- encouraged pt to continue healthy diet and regular exercise. Will obtain routine lab work. Flu shot today.     Assessment & Plan:

## 2019-01-07 ENCOUNTER — Encounter: Payer: PRIVATE HEALTH INSURANCE | Admitting: Family

## 2019-01-07 NOTE — Addendum Note (Signed)
Addended by: Kelle Darting A on: 01/07/2019 08:40 AM   Modules accepted: Orders

## 2019-01-08 ENCOUNTER — Other Ambulatory Visit (INDEPENDENT_AMBULATORY_CARE_PROVIDER_SITE_OTHER): Payer: PRIVATE HEALTH INSURANCE

## 2019-01-08 ENCOUNTER — Encounter: Payer: Self-pay | Admitting: Family

## 2019-01-08 ENCOUNTER — Other Ambulatory Visit: Payer: Self-pay

## 2019-01-08 DIAGNOSIS — Z0000 Encounter for general adult medical examination without abnormal findings: Secondary | ICD-10-CM | POA: Diagnosis not present

## 2019-01-08 LAB — CBC WITH DIFFERENTIAL/PLATELET
Basophils Absolute: 0 10*3/uL (ref 0.0–0.1)
Basophils Relative: 0.7 % (ref 0.0–3.0)
Eosinophils Absolute: 0.2 10*3/uL (ref 0.0–0.7)
Eosinophils Relative: 2.5 % (ref 0.0–5.0)
HCT: 43.4 % (ref 39.0–52.0)
Hemoglobin: 14.8 g/dL (ref 13.0–17.0)
Lymphocytes Relative: 46.7 % — ABNORMAL HIGH (ref 12.0–46.0)
Lymphs Abs: 3 10*3/uL (ref 0.7–4.0)
MCHC: 34.1 g/dL (ref 30.0–36.0)
MCV: 90.9 fl (ref 78.0–100.0)
Monocytes Absolute: 0.7 10*3/uL (ref 0.1–1.0)
Monocytes Relative: 11.1 % (ref 3.0–12.0)
Neutro Abs: 2.5 10*3/uL (ref 1.4–7.7)
Neutrophils Relative %: 39 % — ABNORMAL LOW (ref 43.0–77.0)
Platelets: 257 10*3/uL (ref 150.0–400.0)
RBC: 4.78 Mil/uL (ref 4.22–5.81)
RDW: 12.8 % (ref 11.5–15.5)
WBC: 6.3 10*3/uL (ref 4.0–10.5)

## 2019-01-08 LAB — LIPID PANEL
Cholesterol: 211 mg/dL — ABNORMAL HIGH (ref 0–200)
HDL: 65 mg/dL (ref >39.00)
LDL Cholesterol: 131 mg/dL — ABNORMAL HIGH (ref 0–99)
NonHDL: 145.53
Total CHOL/HDL Ratio: 3
Triglycerides: 75 mg/dL (ref 0.0–149.0)
VLDL: 15 mg/dL (ref 0.0–40.0)

## 2019-01-08 LAB — BASIC METABOLIC PANEL
BUN: 17 mg/dL (ref 6–23)
CO2: 24 mEq/L (ref 19–32)
Calcium: 10.1 mg/dL (ref 8.4–10.5)
Chloride: 99 mEq/L (ref 96–112)
Creatinine, Ser: 1.34 mg/dL (ref 0.40–1.50)
GFR: 58.54 mL/min — ABNORMAL LOW (ref >60.00)
Glucose, Bld: 61 mg/dL — ABNORMAL LOW (ref 70–99)
Potassium: 4.2 mEq/L (ref 3.5–5.1)
Sodium: 138 mEq/L (ref 135–145)

## 2019-01-08 LAB — HEPATIC FUNCTION PANEL
ALT: 14 U/L (ref 0–53)
AST: 28 U/L (ref 0–37)
Albumin: 4.7 g/dL (ref 3.5–5.2)
Alkaline Phosphatase: 48 U/L (ref 39–117)
Bilirubin, Direct: 0.2 mg/dL (ref 0.0–0.3)
Total Bilirubin: 1.4 mg/dL — ABNORMAL HIGH (ref 0.2–1.2)
Total Protein: 7.3 g/dL (ref 6.0–8.3)

## 2019-01-08 LAB — TSH: TSH: 2.71 u[IU]/mL (ref 0.35–4.50)

## 2019-12-15 ENCOUNTER — Other Ambulatory Visit (HOSPITAL_COMMUNITY)
Admission: RE | Admit: 2019-12-15 | Discharge: 2019-12-15 | Disposition: A | Discharge status: Home / Self Care | Payer: PRIVATE HEALTH INSURANCE | Source: Ambulatory Visit | Attending: Family | Admitting: Family

## 2019-12-15 ENCOUNTER — Ambulatory Visit: Payer: PRIVATE HEALTH INSURANCE | Admitting: Family

## 2019-12-15 ENCOUNTER — Other Ambulatory Visit: Payer: Self-pay

## 2019-12-15 VITALS — BP 119/67 | HR 51 | Temp 98.2°F | Resp 16 | Ht 70.0 in | Wt 155.0 lb

## 2019-12-15 DIAGNOSIS — Z113 Encounter for screening for infections with a predominantly sexual mode of transmission: Secondary | ICD-10-CM | POA: Diagnosis present

## 2019-12-15 NOTE — Progress Notes (Signed)
Subjective:    Patient ID: Kelly Bush, male    DOB: January 21, 1976, 44 y.o.   MRN: [id number]  HPI  Patient is a 44 yr old male who presents today requesting STD testing.  He reports that he has a new girlfriend and they have both decided to undergo STD screening. He has no symptoms at this time.      Review of Systems See HPI  Past Medical History:  Diagnosis Date   Genital warts    Gilbert's syndrome 07/14/2016     Social History   Socioeconomic History   Marital status: Single    Spouse name: Not on file   Number of children: 0   Years of education: Not on file   Highest education level: Not on file  Occupational History    Employer: [employer]  Tobacco Use   Smoking status: Never Smoker   Smokeless tobacco: Never Used  Vaping Use   Vaping Use: Never used  Substance and Sexual Activity   Alcohol use: Yes    Alcohol/week: 1.0 standard drink    Types: 1 Standard drinks or equivalent per week    Comment: once every 1 - 2 weeks   Drug use: No   Sexual activity: Yes    Partners: Female  Other Topics Concern   Not on file  Social History Narrative   Regular exercise:  3 x weekly   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   Bachelors degree   No children   Soccer, movies, sports   No pets   Social Determinants of Health   Financial Resource Strain:    Difficulty of Paying Living Expenses: Not on file  Food Insecurity:    Worried About Programme researcher, broadcasting/film/video in the Last Year: Not on file   [employer] of Food in the Last Year: Not on file  Transportation Needs:    Lack of Transportation (Medical): Not on file   Lack of Transportation (Non-Medical): Not on file  Physical Activity:    Days of Exercise per Week: Not on file   Minutes of Exercise per Session: Not on file  Stress:    Feeling of Stress : Not on file  Social Connections:    Frequency of Communication with Friends and  Family: Not on file   Frequency of Social Gatherings with Friends and Family: Not on file   Attends Religious Services: Not on file   Active Member of Clubs or Organizations: Not on file   Attends Banker Meetings: Not on file   Marital Status: Not on file  Intimate Partner Violence:    Fear of Current or Ex-Partner: Not on file   Emotionally Abused: Not on file   Physically Abused: Not on file   Sexually Abused: Not on file    Past Surgical History:  Procedure Laterality Date   FRACTURE SURGERY  09/2009   right leg fx, has rod in leg   LEG SURGERY  2001   left leg surgery ?osteoma?    Family History  Problem Relation Age of Onset   Hypertension Mother    Diabetes Paternal Aunt    Cancer Maternal Grandmother        breast   Hyperlipidemia Brother     No Known Allergies  No current outpatient medications on file prior to visit.   No current facility-administered medications on file prior to visit.    BP 119/67 (BP  Location: Right Arm, Patient Position: Sitting, Cuff Size: Small)    Pulse (!) 51    Temp 98.2 F (36.8 C) (Oral)    Resp 16    Ht 5\' 10"  (1.778 m)    Wt 155 lb (70.3 kg)    SpO2 100%    BMI 22.24 kg/m       Objective:   Physical Exam Constitutional:      Appearance: Normal appearance.  HENT:     Head: Normocephalic and atraumatic.  Neurological:     Mental Status: He is alert and oriented to person, place, and time.  Psychiatric:        Attention and Perception: Attention normal.        Mood and Affect: Mood normal.        Speech: Speech normal.        Behavior: Behavior normal.           Assessment & Plan:  STD screening- see orders.  Advised pt that we will notify him via mychart of his results.  This visit occurred during the SARS-CoV-2 public health emergency.  Safety protocols were in place, including screening questions prior to the visit, additional usage of staff PPE, and extensive cleaning of exam room  while observing appropriate contact time as indicated for disinfecting solutions.

## 2019-12-16 ENCOUNTER — Encounter: Payer: Self-pay | Admitting: Family

## 2019-12-16 LAB — URINE CYTOLOGY ANCILLARY ONLY
Chlamydia: NEGATIVE
Comment: NEGATIVE
Comment: NEGATIVE
Comment: NORMAL
Neisseria Gonorrhea: NEGATIVE
Trichomonas: NEGATIVE

## 2019-12-17 LAB — RPR+HSVIGM+HBSAG+HSV2(IGG)+...
HIV Screen 4th Generation wRfx: NONREACTIVE
HSV 2 IgG, Type Spec: <0.91 index (ref 0.00–0.90)
HSVI/II Comb IgM: <0.91 Ratio (ref 0.00–0.90)
Hepatitis B Surface Ag: NEGATIVE
RPR Ser Ql: NONREACTIVE

## 2020-02-04 ENCOUNTER — Encounter: Payer: PRIVATE HEALTH INSURANCE | Admitting: Family

## 2020-02-11 ENCOUNTER — Encounter: Payer: Self-pay | Admitting: Family

## 2020-02-11 ENCOUNTER — Other Ambulatory Visit: Payer: Self-pay

## 2020-02-11 ENCOUNTER — Ambulatory Visit (INDEPENDENT_AMBULATORY_CARE_PROVIDER_SITE_OTHER): Payer: PRIVATE HEALTH INSURANCE | Admitting: Family

## 2020-02-11 VITALS — BP 110/62 | HR 69 | Temp 98.3°F | Resp 16 | Ht 70.0 in | Wt 155.0 lb

## 2020-02-11 DIAGNOSIS — Z23 Encounter for immunization: Secondary | ICD-10-CM

## 2020-02-11 DIAGNOSIS — E1169 Type 2 diabetes mellitus with other specified complication: Secondary | ICD-10-CM

## 2020-02-11 DIAGNOSIS — Z0000 Encounter for general adult medical examination without abnormal findings: Secondary | ICD-10-CM

## 2020-02-11 DIAGNOSIS — E785 Hyperlipidemia, unspecified: Secondary | ICD-10-CM

## 2020-02-11 NOTE — Patient Instructions (Signed)
Continue healthy diet and regular exercise. Schedule routine dental exam.  Preventive Care 8-43 Years Old, Male Preventive care refers to lifestyle choices and visits with your health care provider that can promote health and wellness. This includes:  A yearly physical exam. This is also called an annual well check.  Regular dental and eye exams.  Immunizations.  Screening for certain conditions.  Healthy lifestyle choices, such as eating a healthy diet, getting regular exercise, not using drugs or products that contain nicotine and tobacco, and limiting alcohol use. What can I expect for my preventive care visit? Physical exam Your health care provider will check:  Height and weight. These may be used to calculate body mass index (BMI), which is a measurement that tells if you are at a healthy weight.  Heart rate and blood pressure.  Your skin for abnormal spots. Counseling Your health care provider may ask you questions about:  Alcohol, tobacco, and drug use.  Emotional well-being.  Home and relationship well-being.  Sexual activity.  Eating habits.  Work and work Statistician. What immunizations do I need?  Influenza (flu) vaccine  This is recommended every year. Tetanus, diphtheria, and pertussis (Tdap) vaccine  You may need a Td booster every 10 years. Varicella (chickenpox) vaccine  You may need this vaccine if you have not already been vaccinated. Zoster (shingles) vaccine  You may need this after age 75. Measles, mumps, and rubella (MMR) vaccine  You may need at least one dose of MMR if you were born in 1957 or later. You may also need a second dose. Pneumococcal conjugate (PCV13) vaccine  You may need this if you have certain conditions and were not previously vaccinated. Pneumococcal polysaccharide (PPSV23) vaccine  You may need one or two doses if you smoke cigarettes or if you have certain conditions. Meningococcal conjugate (MenACWY)  vaccine  You may need this if you have certain conditions. Hepatitis A vaccine  You may need this if you have certain conditions or if you travel or work in places where you may be exposed to hepatitis A. Hepatitis B vaccine  You may need this if you have certain conditions or if you travel or work in places where you may be exposed to hepatitis B. Haemophilus influenzae type b (Hib) vaccine  You may need this if you have certain risk factors. Human papillomavirus (HPV) vaccine  If recommended by your health care provider, you may need three doses over 6 months. You may receive vaccines as individual doses or as more than one vaccine together in one shot (combination vaccines). Talk with your health care provider about the risks and benefits of combination vaccines. What tests do I need? Blood tests  Lipid and cholesterol levels. These may be checked every 5 years, or more frequently if you are over 43 years old.  Hepatitis C test.  Hepatitis B test. Screening  Lung cancer screening. You may have this screening every year starting at age 51 if you have a 30-pack-year history of smoking and currently smoke or have quit within the past 15 years.  Prostate cancer screening. Recommendations will vary depending on your family history and other risks.  Colorectal cancer screening. All adults should have this screening starting at age 16 and continuing until age 69. Your health care provider may recommend screening at age 50 if you are at increased risk. You will have tests every 1-10 years, depending on your results and the type of screening test.  Diabetes screening. This is done  by checking your blood sugar (glucose) after you have not eaten for a while (fasting). You may have this done every 1-3 years.  Sexually transmitted disease (STD) testing. Follow these instructions at home: Eating and drinking  Eat a diet that includes fresh fruits and vegetables, whole grains, lean protein,  and low-fat dairy products.  Take vitamin and mineral supplements as recommended by your health care provider.  Do not drink alcohol if your health care provider tells you not to drink.  If you drink alcohol: ? Limit how much you have to 0-2 drinks a day. ? Be aware of how much alcohol is in your drink. In the [place] one drink equals one 12 oz bottle of beer (355 mL), one 5 oz glass of wine (148 mL), or one 1 oz glass of hard liquor (44 mL). Lifestyle  Take daily care of your teeth and gums.  Stay active. Exercise for at least 30 minutes on 5 or more days each week.  Do not use any products that contain nicotine or tobacco, such as cigarettes, e-cigarettes, and chewing tobacco. If you need help quitting, ask your health care provider.  If you are sexually active, practice safe sex. Use a condom or other form of protection to prevent STIs (sexually transmitted infections).  Talk with your health care provider about taking a low-dose aspirin every day starting at age 46. What's next?  Go to your health care provider once a year for a well check visit.  Ask your health care provider how often you should have your eyes and teeth checked.  Stay up to date on all vaccines. This information is not intended to replace advice given to you by your health care provider. Make sure you discuss any questions you have with your health care provider. Document Revised: 04/04/2018 Document Reviewed: 04/04/2018 Elsevier Patient Education  2020 [employer].

## 2020-02-11 NOTE — Progress Notes (Signed)
Subjective:    Patient ID: Kelly Bush, male    DOB: [birth year], 44 y.o.   MRN: [id number]  HPI  Patient presents today for complete physical.  Immunizations:  Completed pfizer series, Flu shot today Td 2018  Diet: healthy Exercise:indoor climbing and soccer Vision: up to date Dental: due Wt Readings from Last 3 Encounters:  02/11/20 155 lb (70.3 kg)  12/15/19 155 lb (70.3 kg)  12/31/18 165 lb 3.2 oz (74.9 kg)    Review of Systems  Constitutional: Negative for unexpected weight change.  HENT: Negative for hearing loss and rhinorrhea.   Eyes: Negative for visual disturbance.  Respiratory: Negative for cough and shortness of breath.   Cardiovascular: Negative for chest pain.  Gastrointestinal: Negative for constipation and diarrhea.  Genitourinary: Negative for dysuria and frequency.  Musculoskeletal: Positive for myalgias (recent left groin pull from soccer). Negative for arthralgias.  Skin: Negative for rash.  Neurological: Negative for headaches.  Hematological: Negative for adenopathy.  Psychiatric/Behavioral:       Denies depression/anxiety   Past Medical History:  Diagnosis Date  . Genital warts   . Gilbert's syndrome 07/14/2016     Social History   Socioeconomic History  . Marital status: Single    Spouse name: Not on file  . Number of children: 0  . Years of education: Not on file  . Highest education level: Not on file  Occupational History    Employer: [employer]  Tobacco Use  . Smoking status: Never Smoker  . Smokeless tobacco: Never Used  Vaping Use  . Vaping Use: Never used  Substance and Sexual Activity  . Alcohol use: Yes    Alcohol/week: 1.0 standard drink    Types: 1 Standard drinks or equivalent per week    Comment: once every 1 - 2 weeks  . Drug use: No  . Sexual activity: Yes    Partners: Female  Other Topics Concern  . 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   Bachelors degree   No children   Soccer, movies, sports   No pets   Social Determinants of Health   Financial Resource Strain:   . Difficulty of Paying Living Expenses: Not on file  Food Insecurity:   . Worried About Programme researcher, broadcasting/film/video in the Last Year: Not on file  . Ran Out of Food in the Last Year: Not on file  Transportation Needs:   . Lack of Transportation (Medical): Not on file  . Lack of Transportation (Non-Medical): Not on file  Physical Activity:   . Days of Exercise per Week: Not on file  . Minutes of Exercise per Session: Not on file  Stress:   . Feeling of Stress : Not on file  Social Connections:   . Frequency of Communication with Friends and Family: Not on file  . Frequency of Social Gatherings with Friends and Family: Not on file  . Attends Religious Services: Not on file  . Active Member of Clubs or Organizations: Not on file  . Attends Banker Meetings: Not on file  . Marital Status: Not on file  Intimate Partner Violence:   . Fear of Current or Ex-Partner: Not on file  . Emotionally Abused: Not on file  . Physically Abused: Not on file  . Sexually Abused: Not on file    Past  Surgical History:  Procedure Laterality Date  . FRACTURE SURGERY  09/2009   right leg fx, has rod in leg  . LEG SURGERY  2001   left leg surgery ?osteoma?    Family History  Problem Relation Age of Onset  . Hypertension Mother   . Diabetes Paternal Aunt   . Cancer Maternal Grandmother        breast  . Hyperlipidemia Brother     No Known Allergies  No current outpatient medications on file prior to visit.   No current facility-administered medications on file prior to visit.    BP 110/62 (BP Location: Right Arm, Patient Position: Sitting, Cuff Size: Small)   Pulse 69   Temp 98.3 F (36.8 C) (Oral)   Resp 16   Ht 5\' 10"  (1.778 m)   Wt 155 lb (70.3 kg)   SpO2 100%   BMI 22.24  kg/m       Objective:   Physical Exam  Physical Exam  Constitutional: He is oriented to person, place, and time. He appears well-developed and well-nourished. No distress.  HENT:  Head: Normocephalic and atraumatic.  Right Ear: Tympanic membrane and ear canal normal.  Left Ear: Tympanic membrane and ear canal normal.  Mouth/Throat: not examined- pt wearing mask Eyes: Pupils are equal, round, and reactive to light. No scleral icterus.  Neck: Normal range of motion. No thyromegaly present.  Cardiovascular: Normal rate and regular rhythm.   No murmur heard. Pulmonary/Chest: Effort normal and breath sounds normal. No respiratory distress. He has no wheezes. He has no rales. He exhibits no tenderness.  Abdominal: Soft. Bowel sounds are normal. He exhibits no distension and no mass. There is no tenderness. There is no rebound and no guarding.  Musculoskeletal: He exhibits no edema.  Lymphadenopathy:    He has no cervical adenopathy.  Neurological: He is alert and oriented to person, place, and time. He has normal patellar reflexes. He exhibits normal muscle tone. Coordination normal.  Skin: Skin is warm and dry.  Psychiatric: He has a normal mood and affect. His behavior is normal. Judgment and thought content normal.           Assessment & Plan:   Preventative care- immunizations reviewed and up to date. (flu shot given today. Encouraged pt to continue healthy diet, exercise.  Labs as ordered.  This visit occurred during the SARS-CoV-2 public health emergency.  Safety protocols were in place, including screening questions prior to the visit, additional usage of staff PPE, and extensive cleaning of exam room while observing appropriate contact time as indicated for disinfecting solutions.         Assessment & Plan:

## 2020-02-12 LAB — LIPID PANEL
Cholesterol: 202 mg/dL — ABNORMAL HIGH (ref <200)
HDL: 72 mg/dL (ref >=40)
LDL Cholesterol (Calc): 117 mg/dL (calc) — ABNORMAL HIGH
Non-HDL Cholesterol (Calc): 130 mg/dL (calc) — ABNORMAL HIGH (ref <130)
Total CHOL/HDL Ratio: 2.8 (calc) (ref <5.0)
Triglycerides: 44 mg/dL (ref <150)

## 2020-02-12 LAB — COMPREHENSIVE METABOLIC PANEL
AG Ratio: 1.8 (calc) (ref 1.0–2.5)
ALT: 11 U/L (ref 9–46)
AST: 18 U/L (ref 10–40)
Albumin: 4.4 g/dL (ref 3.6–5.1)
Alkaline phosphatase (APISO): 41 U/L (ref 36–130)
BUN: 21 mg/dL (ref 7–25)
CO2: 29 mmol/L (ref 20–32)
Calcium: 9.8 mg/dL (ref 8.6–10.3)
Chloride: 103 mmol/L (ref 98–110)
Creat: 1.2 mg/dL (ref 0.60–1.35)
Globulin: 2.5 g/dL (calc) (ref 1.9–3.7)
Glucose, Bld: 87 mg/dL (ref 65–99)
Potassium: 4.4 mmol/L (ref 3.5–5.3)
Sodium: 140 mmol/L (ref 135–146)
Total Bilirubin: 1.7 mg/dL — ABNORMAL HIGH (ref 0.2–1.2)
Total Protein: 6.9 g/dL (ref 6.1–8.1)

## 2020-06-25 ENCOUNTER — Encounter: Payer: Self-pay | Admitting: Family

## 2020-07-08 NOTE — Telephone Encounter (Signed)
I called and spoke to [facility] Customer Service Billing Rep Dorathy Daft and she will reprocess claim.

## 2020-07-12 NOTE — Telephone Encounter (Signed)
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  I have seen this happen more and more with patients lately where insurances are not wanting to pay for lipid panels at yearly visits and explained this to the patient.  My question is, is there another code other than [phone] for the lipid panel that can be billed to [facility] so that pt responsibility will not be $148.10 and another code other than 80053 for CMP where the pt will not be billed $88.07 out of pocket by his [employer].  [facility] rep stated she would try to resend again through pt's insurance with these existing codes, but she stated she was pretty sure they would be rejected again.  Thank you for looking into this.  These charges occurred during the pt's CPE in Oct 2021.

## 2020-07-13 NOTE — Telephone Encounter (Signed)
Hi Kelly Bush- I reviewed my codes from that visit.  Please submit E78.5 (initial diagnosis was incorrect hyperlipidemia in setting of DM2) and Z00.00 for both labs.

## 2020-07-16 NOTE — Telephone Encounter (Signed)
Called [facility] billing and spoke with [facility].  She has resubmitted the claim with the additional dx of z00.00 (wellness exam) since the initial claim was processed with the E78.5 code.  She states it will take 30 days to process thru insurance. I have sent message to pt letting him know the status at this time.

## 2020-10-19 DIAGNOSIS — F4323 Adjustment disorder with mixed anxiety and depressed mood: Secondary | ICD-10-CM | POA: Diagnosis not present

## 2020-11-11 DIAGNOSIS — D2272 Melanocytic nevi of left lower limb, including hip: Secondary | ICD-10-CM | POA: Diagnosis not present

## 2020-11-11 DIAGNOSIS — L237 Allergic contact dermatitis due to plants, except food: Secondary | ICD-10-CM | POA: Diagnosis not present

## 2020-11-11 DIAGNOSIS — B078 Other viral warts: Secondary | ICD-10-CM | POA: Diagnosis not present

## 2020-11-11 DIAGNOSIS — B081 Molluscum contagiosum: Secondary | ICD-10-CM | POA: Diagnosis not present

## 2020-12-07 DIAGNOSIS — F411 Generalized anxiety disorder: Secondary | ICD-10-CM | POA: Diagnosis not present

## 2020-12-07 DIAGNOSIS — F4323 Adjustment disorder with mixed anxiety and depressed mood: Secondary | ICD-10-CM | POA: Diagnosis not present

## 2020-12-10 ENCOUNTER — Other Ambulatory Visit: Payer: Self-pay

## 2020-12-10 ENCOUNTER — Ambulatory Visit (INDEPENDENT_AMBULATORY_CARE_PROVIDER_SITE_OTHER): Payer: BC Managed Care – PPO | Admitting: Family

## 2020-12-10 ENCOUNTER — Other Ambulatory Visit (HOSPITAL_COMMUNITY)
Admission: RE | Admit: 2020-12-10 | Discharge: 2020-12-10 | Disposition: A | Discharge status: Home / Self Care | Payer: BC Managed Care – PPO | Source: Ambulatory Visit | Attending: Family | Admitting: Family

## 2020-12-10 ENCOUNTER — Encounter: Payer: Self-pay | Admitting: Family

## 2020-12-10 VITALS — BP 124/69 | HR 53 | Temp 98.2°F | Resp 16 | Ht 70.0 in | Wt 149.0 lb

## 2020-12-10 DIAGNOSIS — Z0000 Encounter for general adult medical examination without abnormal findings: Secondary | ICD-10-CM | POA: Diagnosis not present

## 2020-12-10 DIAGNOSIS — Z23 Encounter for immunization: Secondary | ICD-10-CM | POA: Diagnosis not present

## 2020-12-10 DIAGNOSIS — Z113 Encounter for screening for infections with a predominantly sexual mode of transmission: Secondary | ICD-10-CM | POA: Diagnosis not present

## 2020-12-10 DIAGNOSIS — E785 Hyperlipidemia, unspecified: Secondary | ICD-10-CM | POA: Diagnosis not present

## 2020-12-10 NOTE — Progress Notes (Signed)
Subjective:   By signing my name below, I, Kelly Bush, attest that this documentation has been prepared under the direction and in the presence of  [name] NP, 12/10/2020    Patient ID: Kelly Bush, male    DOB: Feb 09, 1976, 45 y.o.   MRN: [id number]  Chief Complaint  Patient presents with   Annual Exam    Will like to get STD testing    HPI Patient is in today for comprehensive physical exam.  STD testing- He report that him and his partner separated and he wanted to be sure he did not contract anything. Back pain- He reports that his back pain has improved since he started a new running exercise regiment.  Mood- He recently started seeing a new therapist due to the separation from his significant other. He notes having improvement since starting his sessions.  He denies having any unexpected weight change, ear pain, hearing loss and rhinorrhea, visual disturbance, cough, chest pain and leg swelling, nausea, vomiting, diarrhea and blood in stool, or dysuria and frequency, for myalgias and arthralgias, rash, headaches, adenopathy, depression or anxiety at this time. He has no recent changes to his family medical history.  He drinks 1-2 beers every weekend. He does not use drugs. He only gets with male products but is not active at the moment. He does not use tobacco products. He does not use vaping products.   Immunizations: He has 2 Covid-19 vaccines at this time.  Diet: He is maintaining a healthy diet.  Exercise: He participates in regular exercise by playing soccer.    Health Maintenance Due  Topic Date Due   Hepatitis C Screening  Never done   COVID-19 Vaccine (3 - Booster for Pfizer series) 12/24/2019   INFLUENZA VACCINE  11/22/2020    Past Medical History:  Diagnosis Date   Genital warts    Gilbert's syndrome 07/14/2016    Past Surgical History:  Procedure Laterality Date   FRACTURE SURGERY  09/2009   right leg fx, has rod in leg   LEG SURGERY   2001   left leg surgery ?osteoma?    Family History  Problem Relation Age of Onset   Hypertension Mother    Colon polyps Father    Hyperlipidemia Brother    Cancer Maternal Grandmother        breast   Diabetes Paternal Aunt     Social History   Socioeconomic History   Marital status: Single    Spouse name: Not on file   Number of children: 0   Years of education: Not on file   Highest education level: Not on file  Occupational History    Employer: [employer]  Tobacco Use   Smoking status: Never   Smokeless tobacco: Never  Vaping Use   Vaping Use: Never used  Substance and Sexual Activity   Alcohol use: Yes    Alcohol/week: 2.0 standard drinks    Types: 2 Standard drinks or equivalent per week   Drug use: No   Sexual activity: Yes    Partners: Female  Other Topics Concern   Not on file  Social History Narrative   Regular exercise:  3 x weekly   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   Bachelors degree   No children   Soccer, movies, sports   No pets   Social Determinants of Health   Financial Resource Strain: Not on file  Food Insecurity: Not  on file  Transportation Needs: Not on file  Physical Activity: Not on file  Stress: Not on file  Social Connections: Not on file  Intimate Partner Violence: Not on file    No outpatient medications prior to visit.   No facility-administered medications prior to visit.    No Known Allergies  Review of Systems  Constitutional:        (-)unexpected weight change (-)Adenopathy  HENT:  Negative for hearing loss.        (-)Rhinorrhea   Eyes:        (-)Visual disturbance  Respiratory:  Negative for cough.   Cardiovascular:  Negative for chest pain and leg swelling.  Gastrointestinal:  Negative for blood in stool, constipation, diarrhea, nausea and vomiting.  Genitourinary:  Negative for dysuria and frequency.  Musculoskeletal:  Negative for joint pain  and myalgias.  Skin:  Negative for rash.  Neurological:  Negative for headaches.  Psychiatric/Behavioral:  Negative for depression. The patient is not nervous/anxious.       Objective:    Physical Exam Constitutional:      General: He is not in acute distress.    Appearance: Normal appearance. He is not ill-appearing.  HENT:     Head: Normocephalic and atraumatic.     Right Ear: Tympanic membrane, ear canal and external ear normal.     Left Ear: Tympanic membrane, ear canal and external ear normal.  Eyes:     Extraocular Movements: Extraocular movements intact.     Pupils: Pupils are equal, round, and reactive to light.     Comments: No nystagmus  Cardiovascular:     Rate and Rhythm: Normal rate and regular rhythm.     Heart sounds: Normal heart sounds. No murmur heard.   No gallop.  Pulmonary:     Effort: Pulmonary effort is normal. No respiratory distress.     Breath sounds: Normal breath sounds. No wheezing or rales.  Abdominal:     General: Bowel sounds are normal. There is no distension.     Palpations: Abdomen is soft.     Tenderness: There is no abdominal tenderness. There is no guarding.  Musculoskeletal:     Comments: 5/5 strength in both in upper and lower extremities   Lymphadenopathy:     Cervical: No cervical adenopathy.  Skin:    General: Skin is warm and dry.  Neurological:     Mental Status: He is alert and oriented to person, place, and time.     Deep Tendon Reflexes:     Reflex Scores:      Patellar reflexes are 2+ on the right side and 2+ on the left side. Psychiatric:        Behavior: Behavior normal.    BP 124/69 (BP Location: Right Arm, Patient Position: Sitting, Cuff Size: Small)   Pulse (!) 53   Temp 98.2 F (36.8 C) (Oral)   Resp 16   Ht 5' 10" (1.778 m)   Wt 149 lb (67.6 kg)   SpO2 100%   BMI 21.38 kg/m  Wt Readings from Last 3 Encounters:  12/10/20 149 lb (67.6 kg)  02/11/20 155 lb (70.3 kg)  12/15/19 155 lb (70.3 kg)        Assessment & Plan:   Problem List Items Addressed This Visit       Unprioritized   Routine general medical examination at a health care facility    Encouraged pt to continue healthy diet and regular exercise.  Will plan to begin  colonoscopy at 80. Labs as ordered. Recommended covid booster this fall when new booster becomes available. Other immunizations reviewed and up to date.       Other Visit Diagnoses     Screening examination for STD (sexually transmitted disease)    -  Primary   Relevant Orders   RPR   HSV 2 antibody, IgG   HIV antibody (with reflex)   Hepatitis C Antibody   Urine cytology ancillary only([place])   Hepatitis B Surface AntiGEN   Hyperlipidemia, unspecified hyperlipidemia type       Relevant Orders   Comp Met (CMET)       No orders of the defined types were placed in this encounter.   I, Nance Pear, NP, personally preformed the services described in this documentation.  All medical record entries made by the scribe were at my direction and in my presence.  I have reviewed the chart and discharge instructions (if applicable) and agree that the record reflects my personal performance and is accurate and complete. 12/10/2020   I,Kelly Bush,acting as a Education administrator for Nance Pear, NP.,have documented all relevant documentation on the behalf of Nance Pear, NP,as directed by  Nance Pear, NP while in the presence of Nance Pear, NP.  Nance Pear, NP

## 2020-12-10 NOTE — Addendum Note (Signed)
Addended by: Rosita Kea on: 12/10/2020 03:03 PM   Modules accepted: Orders

## 2020-12-10 NOTE — Assessment & Plan Note (Signed)
Encouraged pt to continue healthy diet and regular exercise.  Will plan to begin colonoscopy at 46. Labs as ordered. Recommended covid booster this fall when new booster becomes available. Other immunizations reviewed and up to date.

## 2020-12-10 NOTE — Addendum Note (Signed)
Addended by: Rosita Kea on: 12/10/2020 03:10 PM   Modules accepted: Orders

## 2020-12-10 NOTE — Addendum Note (Signed)
Addended by: Mertha Finders on: 12/10/2020 02:57 PM   Modules accepted: Orders

## 2020-12-10 NOTE — Addendum Note (Signed)
Addended by: CREFT, Melton Alar L on: 12/10/2020 03:00 PM   Modules accepted: Orders

## 2020-12-12 ENCOUNTER — Telehealth: Payer: Self-pay | Admitting: Family

## 2020-12-12 DIAGNOSIS — R17 Unspecified jaundice: Secondary | ICD-10-CM

## 2020-12-12 NOTE — Telephone Encounter (Signed)
Is it possible for the lab to add on direct bilirubin

## 2020-12-13 LAB — COMPREHENSIVE METABOLIC PANEL
AG Ratio: 1.9 (calc) (ref 1.0–2.5)
ALT: 11 U/L (ref 9–46)
AST: 18 U/L (ref 10–40)
Albumin: 4.7 g/dL (ref 3.6–5.1)
Alkaline phosphatase (APISO): 46 U/L (ref 36–130)
BUN: 15 mg/dL (ref 7–25)
CO2: 25 mmol/L (ref 20–32)
Calcium: 9.6 mg/dL (ref 8.6–10.3)
Chloride: 102 mmol/L (ref 98–110)
Creat: 1.1 mg/dL (ref 0.60–1.29)
Globulin: 2.5 g/dL (calc) (ref 1.9–3.7)
Glucose, Bld: 84 mg/dL (ref 65–99)
Potassium: 4 mmol/L (ref 3.5–5.3)
Sodium: 140 mmol/L (ref 135–146)
Total Bilirubin: 2.4 mg/dL — ABNORMAL HIGH (ref 0.2–1.2)
Total Protein: 7.2 g/dL (ref 6.1–8.1)

## 2020-12-13 LAB — LIPID PANEL
Cholesterol: 189 mg/dL (ref <200)
HDL: 76 mg/dL (ref >=40)
LDL Cholesterol (Calc): 99 mg/dL (calc)
Non-HDL Cholesterol (Calc): 113 mg/dL (calc) (ref <130)
Total CHOL/HDL Ratio: 2.5 (calc) (ref <5.0)
Triglycerides: 57 mg/dL (ref <150)

## 2020-12-13 LAB — URINE CYTOLOGY ANCILLARY ONLY
Chlamydia: NEGATIVE
Comment: NEGATIVE
Comment: NEGATIVE
Comment: NORMAL
Neisseria Gonorrhea: NEGATIVE
Trichomonas: NEGATIVE

## 2020-12-13 LAB — RPR: RPR Ser Ql: NONREACTIVE

## 2020-12-13 LAB — HEPATITIS B SURFACE ANTIGEN: Hepatitis B Surface Ag: NONREACTIVE

## 2020-12-13 LAB — HEPATITIS C ANTIBODY
Hepatitis C Ab: NONREACTIVE
SIGNAL TO CUT-OFF: 0.01 (ref <1.00)

## 2020-12-13 LAB — HSV 2 ANTIBODY, IGG: HSV 2 Glycoprotein G Ab, IgG: <0.9 index

## 2020-12-13 LAB — HIV ANTIBODY (ROUTINE TESTING W REFLEX): HIV 1&2 Ab, 4th Generation: NONREACTIVE

## 2020-12-14 NOTE — Telephone Encounter (Signed)
Contacted [facility] lab per Lawson Fiscal blood is only good for refrigerated for 48 hours and labs were done on Friday 12/10/2020 unable to add test.

## 2020-12-15 ENCOUNTER — Other Ambulatory Visit: Payer: Self-pay

## 2020-12-15 NOTE — Telephone Encounter (Signed)
Patient scheduled to come in for direct bilirubin 12-20-20

## 2020-12-20 ENCOUNTER — Other Ambulatory Visit (INDEPENDENT_AMBULATORY_CARE_PROVIDER_SITE_OTHER): Payer: BC Managed Care – PPO

## 2020-12-20 ENCOUNTER — Other Ambulatory Visit: Payer: Self-pay

## 2020-12-20 DIAGNOSIS — R17 Unspecified jaundice: Secondary | ICD-10-CM | POA: Diagnosis not present

## 2020-12-20 LAB — BILIRUBIN, DIRECT: Bilirubin, Direct: 0.3 mg/dL (ref 0.0–0.3)

## 2020-12-21 DIAGNOSIS — F4323 Adjustment disorder with mixed anxiety and depressed mood: Secondary | ICD-10-CM | POA: Diagnosis not present

## 2020-12-27 ENCOUNTER — Telehealth: Payer: Self-pay | Admitting: Family

## 2020-12-27 DIAGNOSIS — R17 Unspecified jaundice: Secondary | ICD-10-CM

## 2020-12-27 NOTE — Telephone Encounter (Signed)
Please contact patient and let him know that I reviewed his lab work.  His lab work is most consistent with a common disorder called Gilberts which causes elevation of bilirubin (one of the liver tests).  I would like for him to see GI for consultation however since his level has gone up.  It would be good to get their opinion.

## 2020-12-28 ENCOUNTER — Telehealth: Payer: Self-pay | Admitting: Family

## 2020-12-28 NOTE — Telephone Encounter (Signed)
Called but no answer and no vm 

## 2020-12-28 NOTE — Telephone Encounter (Signed)
Pt called regarding bill he received in the amount of $744.85 for date of service 01/09/2021. Pt stated previous insurance was [employer] which is what reflects on bill. But when he came to the office visit he switched his insurance to [employer].  Please advise.

## 2020-12-29 ENCOUNTER — Other Ambulatory Visit: Payer: Self-pay

## 2020-12-29 NOTE — Telephone Encounter (Signed)
Spoke with patient and found out bill was from [employer].  Patient stated some of the codes were billed to his new [employer] plan and some were billed to his old [employer] plan.  I advised patient it would be best for him to contact [facility] and speak to a billing representative and make sure they have everything keyed to correct/current insurance plan.  I also advised patient there may be some labs he had drawn that may not be covered under his annual physical (since I am familiar with pt and billing issues last year with CPE) and he advised he had issues last year with billing and he was not happy with handling billing issues then nor now and would possibly consider another office for care due to this.

## 2020-12-29 NOTE — Telephone Encounter (Signed)
Patient reports he called back yesterday and was given this information. He has agreed to referral.

## 2020-12-29 NOTE — Telephone Encounter (Signed)
Referral entered for hyperbiliruminemia

## 2021-01-06 DIAGNOSIS — F4323 Adjustment disorder with mixed anxiety and depressed mood: Secondary | ICD-10-CM | POA: Diagnosis not present

## 2021-01-10 DIAGNOSIS — B081 Molluscum contagiosum: Secondary | ICD-10-CM | POA: Diagnosis not present

## 2021-01-10 DIAGNOSIS — B078 Other viral warts: Secondary | ICD-10-CM | POA: Diagnosis not present

## 2021-01-14 DIAGNOSIS — Z23 Encounter for immunization: Secondary | ICD-10-CM | POA: Diagnosis not present

## 2021-02-22 DIAGNOSIS — F4323 Adjustment disorder with mixed anxiety and depressed mood: Secondary | ICD-10-CM | POA: Diagnosis not present

## 2021-03-28 DIAGNOSIS — F411 Generalized anxiety disorder: Secondary | ICD-10-CM | POA: Diagnosis not present

## 2021-10-26 ENCOUNTER — Encounter: Payer: Self-pay | Admitting: Family Medicine

## 2021-11-09 NOTE — Progress Notes (Signed)
[facility] [address] [place] [address] Phone: [phone] Subjective:   Kelly Bush, am serving as a scribe for Dr. Antoine Primas.  I'm seeing this patient by the request  of:  Sandford Craze, NP  CC: Low back pain  [id number]  Kelly Bush is a 46 y.o. male coming in with complaint of low back pain. Patient states chronic low back pain. No pain during activity, but afterwards has pain. Sometimes pain can be sharp. Sometimes gets radiating pain into thigh on right side. Stretching helps. He has MRI. Patient brings MRI on his computer.  Reviewing the MRI patient does have spondylolisthesis at the L5-S1 area.  Patient does have a protruding disc noted at the L5-S1 area.  Seems to be more right greater than left with some facet hypertrophy also noted bilaterally.     Past Medical History:  Diagnosis Date   Genital warts    Gilbert's syndrome 07/14/2016   Past Surgical History:  Procedure Laterality Date   FRACTURE SURGERY  09/2009   right leg fx, has rod in leg   LEG SURGERY  2001   left leg surgery ?osteoma?   Social History   Socioeconomic History   Marital status: Single    Spouse name: Not on file   Number of children: 0   Years of education: Not on file   Highest education level: Not on file  Occupational History    Employer: [employer]  Tobacco Use   Smoking status: Never   Smokeless tobacco: Never  Vaping Use   Vaping Use: Never used  Substance and Sexual Activity   Alcohol use: Yes    Alcohol/week: 2.0 standard drinks of alcohol    Types: 2 Standard drinks or equivalent per week   Drug use: No   Sexual activity: Yes    Partners: Female  Other Topics Concern   Not on file  Social History Narrative   Regular exercise:  3 x weekly   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   Bachelors degree   No children   Soccer, movies, sports   No pets    Social Determinants of Health   Financial Resource Strain: Not on file  Food Insecurity: Not on file  Transportation Needs: Not on file  Physical Activity: Not on file  Stress: Not on file  Social Connections: Not on file   No Known Allergies Family History  Problem Relation Age of Onset   Hypertension Mother    Colon polyps Father    Hyperlipidemia Brother    Cancer Maternal Grandmother        breast   Diabetes Paternal Aunt          Current Outpatient Medications (Other):    gabapentin (NEURONTIN) 100 MG capsule, Take 2 capsules (200 mg total) by mouth at bedtime as needed.   Reviewed prior external information including notes and imaging from  primary care provider As well as notes that were available from care everywhere and other healthcare systems.  Past medical history, social, surgical and family history all reviewed in electronic medical record.  No pertanent information unless stated regarding to the chief complaint.   Review of Systems:  No headache, visual changes, nausea, vomiting, diarrhea, constipation, dizziness, abdominal pain, skin rash, fevers, chills, night sweats, weight loss, swollen lymph nodes, body aches, joint swelling, chest pain, shortness of breath, mood changes. POSITIVE muscle aches  Objective  Blood pressure 116/70, pulse 65, height 5\' 10"  (1.778 m), weight 161 lb (73 kg), SpO2 98 %.   General: No apparent distress alert and oriented x3 mood and affect normal, dressed appropriately.  HEENT: Pupils equal, extraocular movements intact  Respiratory: Patient's speak in full sentences and does not appear short of breath  Cardiovascular: No lower extremity edema, non tender, no erythema  Low back exam does have some loss of lordosis.  Some tenderness to palpation in the paraspinal musculature bilaterally right greater than left.  Patient does have mild tightness with FABER test right greater than left and mild tenderness over the sacroiliac  joint.  Negative straight leg test with 5-5 strength.  Osteopathic findings  T4 extended rotated and side bent left L1 flexed rotated and side bent right Sacrum right on right  [id number]; 15 additional minutes spent for Therapeutic exercises as stated in above notes.  This included exercises focusing on stretching, strengthening, with significant focus on eccentric aspects.   Long term goals include an improvement in range of motion, strength, endurance as well as avoiding reinjury. Patient's frequency would include in 1-2 times a day, 3-5 times a week for a duration of 6-12 weeks. Low back exercises that included:  Pelvic tilt/bracing instruction to focus on control of the pelvic girdle and lower abdominal muscles  Glute strengthening exercises, focusing on proper firing of the glutes without engaging the low back muscles Proper stretching techniques for maximum relief for the hamstrings, hip flexors, low back and some rotation where tolerated   Proper technique shown and discussed handout in great detail with ATC.  All questions were discussed and answered.      Impression and Recommendations:    The above documentation has been reviewed and is accurate and complete , DO

## 2021-11-15 ENCOUNTER — Ambulatory Visit: Payer: 59 | Admitting: Family Medicine

## 2021-11-15 VITALS — BP 116/70 | HR 65 | Ht 70.0 in | Wt 161.0 lb

## 2021-11-15 DIAGNOSIS — M5431 Sciatica, right side: Secondary | ICD-10-CM

## 2021-11-15 DIAGNOSIS — M9902 Segmental and somatic dysfunction of thoracic region: Secondary | ICD-10-CM | POA: Diagnosis not present

## 2021-11-15 DIAGNOSIS — M9903 Segmental and somatic dysfunction of lumbar region: Secondary | ICD-10-CM

## 2021-11-15 DIAGNOSIS — M9904 Segmental and somatic dysfunction of sacral region: Secondary | ICD-10-CM | POA: Diagnosis not present

## 2021-11-15 MED ORDER — GABAPENTIN 100 MG PO CAPS: 200.0000 mg | ORAL_CAPSULE | Freq: Every evening | ORAL | 0 refills | Status: DC | PRN

## 2021-11-15 NOTE — Assessment & Plan Note (Signed)

## 2021-11-15 NOTE — Patient Instructions (Addendum)
Do prescribed exercises at least 3x a week Gabapentin 200mg  prescribed Tried some manipulation hopefully that will be helpful See you again in 5-6 weeks

## 2021-11-15 NOTE — Assessment & Plan Note (Signed)
Patient does have spondylolisthesis and the patient showed me today.  Patient does have a small disc protrusion causing some nerve root impingement noted as well.  Seems to be on the right side.  Discussed with patient icing regimen and home exercises, responded extremely well to osteopathic manipulation.  Patient is to increase activity slowly otherwise.  Follow-up again in 6 to 8 weeks.  We discussed gabapentin as well which was prescribed for the radicular symptoms.  Patient is somewhat hesitant of using it on a regular basis.

## 2021-11-18 ENCOUNTER — Ambulatory Visit: Payer: BC Managed Care – PPO | Admitting: Family Medicine

## 2021-12-20 NOTE — Progress Notes (Signed)
  [name] [address] [place] [address] Phone: [phone] Subjective:   Kelly Bush, am serving as a scribe for Dr. Antoine Primas.  I'm seeing this patient by the request  of:  Sandford Craze, NP  CC: back and neck pain follow up   MEQ:[id number]  Kelly Bush is a 46 y.o. male coming in with complaint of back and neck pain. OMT 11/15/2021. Patient states back is better just ready for typical adjustment.  Medications patient has been prescribed: Gabapentin  Taking:nothing         Reviewed prior external information including notes and imaging from previsou exam, outside providers and external EMR if available.   As well as notes that were available from care everywhere and other healthcare systems.  Past medical history, social, surgical and family history all reviewed in electronic medical record.  No pertanent information unless stated regarding to the chief complaint.   Past Medical History:  Diagnosis Date   Genital warts    Gilbert's syndrome 07/14/2016    No Known Allergies   Review of Systems:  No headache, visual changes, nausea, vomiting, diarrhea, constipation, dizziness, abdominal pain, skin rash, fevers, chills, night sweats, weight loss, swollen lymph nodes, body aches, joint swelling, chest pain, shortness of breath, mood changes. POSITIVE muscle aches  Objective  Blood pressure 102/70, pulse (!) 52, height 5\' 10"  (1.778 m), weight 160 lb (72.6 kg), SpO2 98 %.   General: No apparent distress alert and oriented x3 mood and affect normal, dressed appropriately.  HEENT: Pupils equal, extraocular movements intact  Respiratory: Patient's speak in full sentences and does not appear short of breath  Cardiovascular: No lower extremity edema, non tender, no erythema  Gait normal  MSK:  Back low back does have some mild loss of lordosis.  Tightness noted of the hip flexors left greater than right.  5 out of 5  strength of the lower extremity  Osteopathic findings  C6 flexed rotated and side bent left T9 extended rotated and side bent left inhaled rib L2 flexed rotated and side bent right Sacrum left on left       Assessment and Plan:  Sciatica Stable with no radiation  Discussed HEP  Gabapentin discussed  RTC in 6-8 weeks     Nonallopathic problems  Decision today to treat with OMT was based on Physical Exam  After verbal consent patient was treated with HVLA, ME, FPR techniques in cervical, rib, thoracic, lumbar, and sacral  areas  Patient tolerated the procedure well with improvement in symptoms  Patient given exercises, stretches and lifestyle modifications  See medications in patient instructions if given  Patient will follow up in 4-8 weeks    The above documentation has been reviewed and is accurate and complete , DO          Note: This dictation was prepared with Dragon dictation along with smaller phrase technology. Any transcriptional errors that result from this process are unintentional.

## 2021-12-30 ENCOUNTER — Ambulatory Visit: Payer: 59 | Admitting: Family Medicine

## 2021-12-30 VITALS — BP 102/70 | HR 52 | Ht 70.0 in | Wt 160.0 lb

## 2021-12-30 DIAGNOSIS — M9901 Segmental and somatic dysfunction of cervical region: Secondary | ICD-10-CM | POA: Diagnosis not present

## 2021-12-30 DIAGNOSIS — M9903 Segmental and somatic dysfunction of lumbar region: Secondary | ICD-10-CM | POA: Diagnosis not present

## 2021-12-30 DIAGNOSIS — M9908 Segmental and somatic dysfunction of rib cage: Secondary | ICD-10-CM | POA: Diagnosis not present

## 2021-12-30 DIAGNOSIS — M5431 Sciatica, right side: Secondary | ICD-10-CM

## 2021-12-30 DIAGNOSIS — M9904 Segmental and somatic dysfunction of sacral region: Secondary | ICD-10-CM

## 2021-12-30 DIAGNOSIS — M9902 Segmental and somatic dysfunction of thoracic region: Secondary | ICD-10-CM

## 2021-12-30 NOTE — Patient Instructions (Addendum)
Good to see you  Good luck this weekend Keep stretching those hip flexors Follow up in 6-8 weeks

## 2021-12-30 NOTE — Assessment & Plan Note (Signed)
Stable with no radiation  Discussed HEP  Gabapentin discussed  RTC in 6-8 weeks

## 2022-03-10 ENCOUNTER — Ambulatory Visit: Payer: 59 | Admitting: Family Medicine

## 2022-07-20 ENCOUNTER — Ambulatory Visit (INDEPENDENT_AMBULATORY_CARE_PROVIDER_SITE_OTHER): Payer: BC Managed Care – PPO | Admitting: Family

## 2022-07-20 ENCOUNTER — Other Ambulatory Visit (HOSPITAL_BASED_OUTPATIENT_CLINIC_OR_DEPARTMENT_OTHER): Payer: Self-pay

## 2022-07-20 ENCOUNTER — Encounter: Payer: Self-pay | Admitting: Family

## 2022-07-20 VITALS — BP 112/62 | HR 49 | Temp 97.8°F | Resp 16 | Ht 70.0 in | Wt 166.0 lb

## 2022-07-20 DIAGNOSIS — E785 Hyperlipidemia, unspecified: Secondary | ICD-10-CM | POA: Diagnosis not present

## 2022-07-20 DIAGNOSIS — Z0000 Encounter for general adult medical examination without abnormal findings: Secondary | ICD-10-CM | POA: Diagnosis not present

## 2022-07-20 DIAGNOSIS — Z23 Encounter for immunization: Secondary | ICD-10-CM | POA: Diagnosis not present

## 2022-07-20 DIAGNOSIS — Z1211 Encounter for screening for malignant neoplasm of colon: Secondary | ICD-10-CM

## 2022-07-20 LAB — CBC WITH DIFFERENTIAL/PLATELET
Basophils Absolute: 0.1 10*3/uL (ref 0.0–0.1)
Basophils Relative: 1 % (ref 0.0–3.0)
Eosinophils Absolute: 0.2 10*3/uL (ref 0.0–0.7)
Eosinophils Relative: 3.3 % (ref 0.0–5.0)
HCT: 42.7 % (ref 39.0–52.0)
Hemoglobin: 14.3 g/dL (ref 13.0–17.0)
Lymphocytes Relative: 50.4 % — ABNORMAL HIGH (ref 12.0–46.0)
Lymphs Abs: 2.5 10*3/uL (ref 0.7–4.0)
MCHC: 33.5 g/dL (ref 30.0–36.0)
MCV: 90.1 fl (ref 78.0–100.0)
Monocytes Absolute: 0.5 10*3/uL (ref 0.1–1.0)
Monocytes Relative: 10.8 % (ref 3.0–12.0)
Neutro Abs: 1.7 10*3/uL (ref 1.4–7.7)
Neutrophils Relative %: 34.5 % — ABNORMAL LOW (ref 43.0–77.0)
Platelets: 234 10*3/uL (ref 150.0–400.0)
RBC: 4.74 Mil/uL (ref 4.22–5.81)
RDW: 13.1 % (ref 11.5–15.5)
WBC: 5.1 10*3/uL (ref 4.0–10.5)

## 2022-07-20 LAB — LIPID PANEL
Cholesterol: 215 mg/dL — ABNORMAL HIGH (ref 0–200)
HDL: 72.4 mg/dL (ref >39.00)
LDL Cholesterol: 130 mg/dL — ABNORMAL HIGH (ref 0–99)
NonHDL: 142.28
Total CHOL/HDL Ratio: 3
Triglycerides: 60 mg/dL (ref 0.0–149.0)
VLDL: 12 mg/dL (ref 0.0–40.0)

## 2022-07-20 LAB — COMPREHENSIVE METABOLIC PANEL
ALT: 16 U/L (ref 0–53)
AST: 21 U/L (ref 0–37)
Albumin: 4.6 g/dL (ref 3.5–5.2)
Alkaline Phosphatase: 40 U/L (ref 39–117)
BUN: 18 mg/dL (ref 6–23)
CO2: 29 mEq/L (ref 19–32)
Calcium: 9.4 mg/dL (ref 8.4–10.5)
Chloride: 102 mEq/L (ref 96–112)
Creatinine, Ser: 1.03 mg/dL (ref 0.40–1.50)
GFR: 87.89 mL/min (ref >60.00)
Glucose, Bld: 73 mg/dL (ref 70–99)
Potassium: 4.2 mEq/L (ref 3.5–5.1)
Sodium: 137 mEq/L (ref 135–145)
Total Bilirubin: 1.8 mg/dL — ABNORMAL HIGH (ref 0.2–1.2)
Total Protein: 7.1 g/dL (ref 6.0–8.3)

## 2022-07-20 LAB — TSH: TSH: 0.97 u[IU]/mL (ref 0.35–5.50)

## 2022-07-20 MED ORDER — FLUARIX QUADRIVALENT 0.5 ML IM SUSY
PREFILLED_SYRINGE | INTRAMUSCULAR | 0 refills | Status: DC
  Filled 2022-07-20: qty 0.5, 1d supply, fill #0

## 2022-07-20 NOTE — Progress Notes (Signed)
Subjective:     Patient ID: Kelly Bush, male    DOB: [birth year], 47 y.o.   MRN: [id number]  Chief Complaint  Patient presents with   Annual Exam         HPI Patient is in today for annual cpe.  Immunizations: would like flu shot today. Had covid 06/27/22 Diet: working on improving diet Exercise:  soccer, rock climbing Colonoscopy: due Vision: due, wears glasses Dental: up to date  Health Maintenance Due  Topic Date Due   INFLUENZA VACCINE  11/22/2021   COVID-19 Vaccine (3 - 2023-24 season) 12/23/2021   COLONOSCOPY (Pts 45-26yrs Insurance coverage will need to be confirmed)  Never done    Past Medical History:  Diagnosis Date   Genital warts    Gilbert's syndrome 07/14/2016    Past Surgical History:  Procedure Laterality Date   FRACTURE SURGERY  09/2009   right leg fx, has rod in leg   LEG SURGERY  2001   left leg surgery ?osteoma?    Family History  Problem Relation Age of Onset   Hypertension Mother    Colon polyps Father    Hyperlipidemia Brother    Cancer Maternal Grandmother        breast   Diabetes Paternal Aunt     Social History   Socioeconomic History   Marital status: Single    Spouse name: Not on file   Number of children: 0   Years of education: Not on file   Highest education level: Not on file  Occupational History    Employer: [employer]  Tobacco Use   Smoking status: Never   Smokeless tobacco: Never  Vaping Use   Vaping Use: Never used  Substance and Sexual Activity   Alcohol use: Yes    Alcohol/week: 2.0 standard drinks of alcohol    Types: 2 Standard drinks or equivalent per week   Drug use: No   Sexual activity: Yes    Partners: Female  Other Topics Concern   Not on file  Social History Narrative   Regular exercise:  3 x weekly   Working for [employer] from home   Lives with fiance   Bachelors degree   No children   Soccer, movies, sports   No pets   Social Determinants of Radio broadcast assistant  Strain: Not on file  Food Insecurity: Not on file  Transportation Needs: Not on file  Physical Activity: Not on file  Stress: Not on file  Social Connections: Not on file  Intimate Partner Violence: Not on file    Outpatient Medications Prior to Visit  Medication Sig Dispense Refill   ibuprofen (ADVIL) 200 MG tablet Take 200 mg by mouth every 6 (six) hours as needed.     gabapentin (NEURONTIN) 100 MG capsule Take 2 capsules (200 mg total) by mouth at bedtime as needed. (Patient not taking: Reported on 12/30/2021) 60 capsule 0   No facility-administered medications prior to visit.    No Known Allergies  Review of Systems  Constitutional:  Negative for weight loss.  HENT:  Negative for congestion and hearing loss.   Eyes:  Positive for blurred vision (needs to update glasses).  Respiratory:  Negative for cough.   Cardiovascular:  Negative for leg swelling.  Gastrointestinal:  Negative for constipation and diarrhea.  Genitourinary:  Negative for dysuria and frequency.  Musculoskeletal:  Positive for back pain (L5/S1- acts up when he is inactive).  Skin:  Negative for rash.  Neurological:  Positive for headaches (occasional mild tension headaches from screens).  Psychiatric/Behavioral:         Denies depression/anxiety       Objective:    Physical Exam  BP 112/62 (BP Location: Right Arm, Patient Position: Sitting, Cuff Size: Small)   Pulse (!) 49   Temp 97.8 F (36.6 C) (Oral)   Resp 16   Ht 5\' 10"  (1.778 m)   Wt 166 lb (75.3 kg)   SpO2 100%   BMI 23.82 kg/m  Wt Readings from Last 3 Encounters:  07/20/22 166 lb (75.3 kg)  12/30/21 160 lb (72.6 kg)  11/15/21 161 lb (73 kg)   Physical Exam  Constitutional: He is oriented to person, place, and time. He appears well-developed and well-nourished. No distress.  HENT:  Head: Normocephalic and atraumatic.  Right Ear: Tympanic membrane and ear canal normal.  Left Ear: Tympanic membrane and ear canal normal.  Mouth/Throat:  Oropharynx is clear and moist.  Eyes: Pupils are equal, round, and reactive to light. No scleral icterus.  Neck: Normal range of motion. No thyromegaly present.  Cardiovascular: Normal rate and regular rhythm.   No murmur heard. Pulmonary/Chest: Effort normal and breath sounds normal. No respiratory distress. He has no wheezes. He has no rales. He exhibits no tenderness.  Abdominal: Soft. Bowel sounds are normal. He exhibits no distension and no mass. There is no tenderness. There is no rebound and no guarding.  Musculoskeletal: He exhibits no edema.  Lymphadenopathy:    He has no cervical adenopathy.  Neurological: He is alert and oriented to person, place, and time. He has normal patellar reflexes. He exhibits normal muscle tone. Coordination normal.  Skin: Skin is warm and dry.  Psychiatric: He has a normal mood and affect. His behavior is normal. Judgment and thought content normal.           Assessment & Plan:       Assessment & Plan:   Problem List Items Addressed This Visit       Unprioritized   Routine general medical examination at a health care facility    Continue healthy diet and exercise efforts.  Encouraged him to set up follow up eye exam.  He plans to get flu shot at [facility], we no longer have them in stock.  Labs as ordered. Refer for colonoscopy.       Other Visit Diagnoses     Screening for colon cancer    -  Primary   Relevant Orders   Ambulatory referral to Gastroenterology   Preventative health care       Relevant Orders   CBC w/Diff   TSH   Hyperlipidemia, unspecified hyperlipidemia type       Relevant Orders   Comp Met (CMET)   Lipid panel       I have discontinued Luisenrique A. Abbs's gabapentin. I am also having him maintain his ibuprofen.  No orders of the defined types were placed in this encounter.

## 2022-07-20 NOTE — Assessment & Plan Note (Addendum)
Continue healthy diet and exercise efforts.  Encouraged him to set up follow up eye exam.  He plans to get flu shot at [facility], we no longer have them in stock.  Labs as ordered. Refer for colonoscopy.

## 2022-07-24 ENCOUNTER — Encounter: Payer: Self-pay | Admitting: Gastroenterology

## 2022-08-04 ENCOUNTER — Ambulatory Visit (AMBULATORY_SURGERY_CENTER): Payer: BC Managed Care – PPO | Admitting: *Deleted

## 2022-08-04 ENCOUNTER — Encounter: Payer: Self-pay | Admitting: Gastroenterology

## 2022-08-04 VITALS — Ht 70.0 in | Wt 162.0 lb

## 2022-08-04 DIAGNOSIS — Z1211 Encounter for screening for malignant neoplasm of colon: Secondary | ICD-10-CM

## 2022-08-04 MED ORDER — NA SULFATE-K SULFATE-MG SULF 17.5-3.13-1.6 GM/177ML PO SOLN: 1.0000 | Freq: Once | ORAL | 0 refills | Status: AC

## 2022-08-04 NOTE — Progress Notes (Signed)
Pt's pre-visit is done over the phone and all paperwork (prep instructions) sent to patient. Pt's name and DOB verified at the beginning of the pre-visit. Pt denies any difficulty with ambulating.  No egg or soy allergy known to patient  No issues known to pt with past sedation with any surgeries or procedures Pt denies having issues being intubated Pt has no issues moving head neck or swallowing No FH of Malignant Hyperthermia Pt is not on diet pills Pt is not on home 02  Pt is not on blood thinners  Pt denies issues with constipation  Pt is not on dialysis Pt denies any upcoming cardiac testing Pt encouraged to use to use Singlecare or Goodrx to reduce cost  Patient's chart reviewed by John Nulty CNRA prior to pre-visit and patient appropriate for the [facility].  Pre-visit completed and red dot placed by patient's name on their procedure day (on provider's schedule).  . Visit by phone Pt states  weight is  Instructions reviewed with pt and pt states understanding. Instructed to review again prior to procedure. Pt states they will.  Instructions sent by mail with coupon and by my chart 

## 2022-08-20 ENCOUNTER — Encounter: Payer: Self-pay | Admitting: Certified Registered Nurse Anesthetist

## 2022-08-23 ENCOUNTER — Ambulatory Visit (AMBULATORY_SURGERY_CENTER): Payer: BC Managed Care – PPO | Admitting: Gastroenterology

## 2022-08-23 ENCOUNTER — Encounter: Payer: Self-pay | Admitting: Gastroenterology

## 2022-08-23 VITALS — BP 96/63 | HR 53 | Temp 98.2°F | Resp 13 | Ht 70.0 in | Wt 162.0 lb

## 2022-08-23 DIAGNOSIS — Z1211 Encounter for screening for malignant neoplasm of colon: Secondary | ICD-10-CM | POA: Diagnosis not present

## 2022-08-23 DIAGNOSIS — D122 Benign neoplasm of ascending colon: Secondary | ICD-10-CM

## 2022-08-23 MED ORDER — SODIUM CHLORIDE 0.9 % IV SOLN
500.0000 mL | INTRAVENOUS | Status: AC
Start: 2022-08-23 — End: ?

## 2022-08-23 NOTE — Progress Notes (Signed)
Called to room to assist during endoscopic procedure.  Patient ID and intended procedure confirmed with present staff. Received instructions for my participation in the procedure from the performing physician.  

## 2022-08-23 NOTE — Progress Notes (Signed)
History & Physical  Primary Care Physician:  Sandford Craze, NP Primary Gastroenterologist: Claudette Head, MD  Impression / Plan:  Average risk CRC screening for colonoscopy.  CHIEF COMPLAINT:  CRC screening  HPI: Kelly Bush is a 47 y.o. male average risk CRC screening for colonoscopy.   Past Medical History:  Diagnosis Date   Allergy    Seasonal   Genital warts    Gilbert's syndrome 07/14/2016    Past Surgical History:  Procedure Laterality Date   FRACTURE SURGERY  09/2009   right leg fx, has rod in leg   LEG SURGERY  2001   left leg surgery ?osteoma?    Prior to Admission medications   Medication Sig Start Date End Date Taking? Authorizing Provider  ibuprofen (ADVIL) 200 MG tablet Take 200 mg by mouth every 6 (six) hours as needed. Patient not taking: Reported on 08/04/2022    [provider]  influenza vac split quadrivalent PF (FLUARIX QUADRIVALENT) 0.5 ML injection Inject into the muscle. 07/20/22   Judyann Munson, MD    Current Outpatient Medications  Medication Sig Dispense Refill   ibuprofen (ADVIL) 200 MG tablet Take 200 mg by mouth every 6 (six) hours as needed. (Patient not taking: Reported on 08/04/2022)     influenza vac split quadrivalent PF (FLUARIX QUADRIVALENT) 0.5 ML injection Inject into the muscle. 0.5 mL 0   Current Facility-Administered Medications  Medication Dose Route Frequency Provider Last Rate Last Admin   0.9 %  sodium chloride infusion  500 mL Intravenous Continuous Meryl Dare, MD        Allergies as of 08/23/2022   (No Known Allergies)    Family History  Problem Relation Age of Onset   Hypertension Mother    Colon polyps Father    Hyperlipidemia Brother    Diabetes Paternal Aunt    Cancer Maternal Grandmother        breast   Colon cancer Neg Hx    Esophageal cancer Neg Hx    Rectal cancer Neg Hx    Stomach cancer Neg Hx     Social History   Socioeconomic History   Marital status: Single    Spouse  name: Not on file   Number of children: 0   Years of education: Not on file   Highest education level: Not on file  Occupational History    Employer: [employer]  Tobacco Use   Smoking status: Never   Smokeless tobacco: Never  Vaping Use   Vaping Use: Never used  Substance and Sexual Activity   Alcohol use: Yes    Alcohol/week: 2.0 standard drinks of alcohol    Types: 2 Standard drinks or equivalent per week   Drug use: No   Sexual activity: Yes    Partners: Female  Other Topics Concern   Not on file  Social History Narrative   Regular exercise:  3 x weekly   Working for [employer] from home   Lives with fiance   Bachelors degree   No children   Soccer, movies, sports   No pets   Social Determinants of Health   Financial Resource Strain: Not on file  Food Insecurity: Not on file  Transportation Needs: Not on file  Physical Activity: Not on file  Stress: Not on file  Social Connections: Not on file  Intimate Partner Violence: Not on file    Review of Systems:  All systems reviewed were negative except where noted in HPI.   Physical  Exam: General:  Alert, well-developed, in NAD Head:  Normocephalic and atraumatic. Eyes:  Sclera clear, no icterus.   Conjunctiva pink. Ears:  Normal auditory acuity. Mouth:  No deformity or lesions.  Neck:  Supple; no masses. Lungs:  Clear throughout to auscultation.   No wheezes, crackles, or rhonchi.  Heart:  Regular rate and rhythm; no murmurs. Abdomen:  Soft, nondistended, nontender. No masses, hepatomegaly. No palpable masses.  Normal bowel sounds.    Rectal:  Deferred   Msk:  Symmetrical without gross deformities. Extremities:  Without edema. Neurologic:  Alert and  oriented x 4; grossly normal neurologically. Skin:  Intact without significant lesions or rashes. Psych:  Alert and cooperative. Normal mood and affect.   Venita Lick. Russella Dar  08/23/2022, 1:27 PM See Loretha Stapler, [facility], to contact our on call  provider

## 2022-08-23 NOTE — Progress Notes (Signed)
1340 Ephedrine 10 mg given IV due to low BP, MD updated.   

## 2022-08-23 NOTE — Progress Notes (Signed)
Patient reports no changes in health or medications since pre visit. 

## 2022-08-23 NOTE — Op Note (Signed)
[facility] Patient Name: Kelly Bush Procedure Date: 08/23/2022 1:31 PM MRN: [id number] Endoscopist: Meryl Dare , MD, [phone] Age: 47 Referring MD:  Date of Birth: 07-29-1975 Gender: Male Account #: [id number] Procedure:                Colonoscopy Indications:              Screening for colorectal malignant neoplasm Medicines:                Monitored Anesthesia Care Procedure:                Pre-Anesthesia Assessment:                           - Prior to the procedure, a History and Physical                            was performed, and patient medications and                            allergies were reviewed. The patient's tolerance of                            previous anesthesia was also reviewed. The risks                            and benefits of the procedure and the sedation                            options and risks were discussed with the patient.                            All questions were answered, and informed consent                            was obtained. Prior Anticoagulants: The patient has                            taken no anticoagulant or antiplatelet agents. ASA                            Grade Assessment: I - A normal, healthy patient.                            After reviewing the risks and benefits, the patient                            was deemed in satisfactory condition to undergo the                            procedure.                           After obtaining informed consent, the colonoscope  was passed under direct vision. Throughout the                            procedure, the patient's blood pressure, pulse, and                            oxygen saturations were monitored continuously. The                            CF [id number] #[id number] was introduced through the anus                            and advanced to the the cecum, identified by                            appendiceal orifice and ileocecal  valve. The                            ileocecal valve, appendiceal orifice, and rectum                            were photographed. The quality of the bowel                            preparation was good. The colonoscopy was performed                            without difficulty. The patient tolerated the                            procedure well. Scope In: 1:34:41 PM Scope Out: 1:50:44 PM Scope Withdrawal Time: 0 hours 13 minutes 14 seconds  Total Procedure Duration: 0 hours 16 minutes 3 seconds  Findings:                 The perianal and digital rectal examinations were                            normal.                           A 5 mm polyp was found in the ascending colon. The                            polyp was sessile. The polyp was removed with a                            cold snare. Resection and retrieval were complete.                           The exam was otherwise without abnormality on                            direct and retroflexion views. Complications:  No immediate complications. Estimated blood loss:                            None. Estimated Blood Loss:     Estimated blood loss: none. Impression:               - One 5 mm polyp in the ascending colon, removed                            with a cold snare. Resected and retrieved.                           - The examination was otherwise normal on direct                            and retroflexion views. Recommendation:           - Repeat colonoscopy after studies are complete for                            surveillance based on pathology results.                           - Patient has a contact number available for                            emergencies. The signs and symptoms of potential                            delayed complications were discussed with the                            patient. Return to normal activities tomorrow.                            Written discharge instructions were  provided to the                            patient.                           - Resume previous diet.                           - Continue present medications.                           - Await pathology results. Meryl Dare, MD 08/23/2022 1:52:47 PM This report has been signed electronically.

## 2022-08-23 NOTE — Progress Notes (Signed)
Report given to PACU, vss 

## 2022-08-23 NOTE — Patient Instructions (Signed)
Please read handouts provided. Continue present medications. Await pathology results.   YOU HAD AN ENDOSCOPIC PROCEDURE TODAY AT THE [facility]:   Refer to the procedure report that was given to you for any specific questions about what was found during the examination.  If the procedure report does not answer your questions, please call your gastroenterologist to clarify.  If you requested that your care partner not be given the details of your procedure findings, then the procedure report has been included in a sealed envelope for you to review at your convenience later.  YOU SHOULD EXPECT: Some feelings of bloating in the abdomen. Passage of more gas than usual.  Walking can help get rid of the air that was put into your GI tract during the procedure and reduce the bloating. If you had a lower endoscopy (such as a colonoscopy or flexible sigmoidoscopy) you may notice spotting of blood in your stool or on the toilet paper. If you underwent a bowel prep for your procedure, you may not have a normal bowel movement for a few days.  Please Note:  You might notice some irritation and congestion in your nose or some drainage.  This is from the oxygen used during your procedure.  There is no need for concern and it should clear up in a day or so.  SYMPTOMS TO REPORT IMMEDIATELY:  Following lower endoscopy (colonoscopy or flexible sigmoidoscopy):  Excessive amounts of blood in the stool  Significant tenderness or worsening of abdominal pains  Swelling of the abdomen that is new, acute  Fever of 100F or higher  For urgent or emergent issues, a gastroenterologist can be reached at any hour by calling [phone]. Do not use MyChart messaging for urgent concerns.    DIET:  We do recommend a small meal at first, but then you may proceed to your regular diet.  Drink plenty of fluids but you should avoid alcoholic beverages for 24 hours.  ACTIVITY:  You should plan to take it easy for  the rest of today and you should NOT DRIVE or use heavy machinery until tomorrow (because of the sedation medicines used during the test).    FOLLOW UP: Our staff will call the number listed on your records the next business day following your procedure.  We will call around 7:15- 8:00 am to check on you and address any questions or concerns that you may have regarding the information given to you following your procedure. If we do not reach you, we will leave a message.     If any biopsies were taken you will be contacted by phone or by letter within the next 1-3 weeks.  Please call us at [phone] if you have not heard about the biopsies in 3 weeks.    SIGNATURES/CONFIDENTIALITY: You and/or your care partner have signed paperwork which will be entered into your electronic medical record.  These signatures attest to the fact that that the information above on your After Visit Summary has been reviewed and is understood.  Full responsibility of the confidentiality of this discharge information lies with you and/or your care-partner. 

## 2022-08-24 ENCOUNTER — Telehealth: Payer: Self-pay | Admitting: *Deleted

## 2022-08-24 NOTE — Telephone Encounter (Signed)
  Follow up Call-     08/23/2022   12:49 PM  Call back number  Post procedure Call Back phone  # [phone]  Permission to leave phone message Yes     Patient questions:  Do you have a fever, pain , or abdominal swelling? No. Pain Score  0 *  Have you tolerated food without any problems? Yes.    Have you been able to return to your normal activities? Yes.    Do you have any questions about your discharge instructions: Diet   No. Medications  No. Follow up visit  No.  Do you have questions or concerns about your Care? No.  Actions: * If pain score is 4 or above: No action needed, pain <4.

## 2022-09-12 ENCOUNTER — Encounter: Payer: Self-pay | Admitting: Gastroenterology

## 2023-01-05 DIAGNOSIS — Z3141 Encounter for fertility testing: Secondary | ICD-10-CM | POA: Diagnosis not present

## 2023-02-20 DIAGNOSIS — Z31440 Encounter of male for testing for genetic disease carrier status for procreative management: Secondary | ICD-10-CM | POA: Diagnosis not present

## 2023-02-20 DIAGNOSIS — Z113 Encounter for screening for infections with a predominantly sexual mode of transmission: Secondary | ICD-10-CM | POA: Diagnosis not present

## 2024-01-21 NOTE — Progress Notes (Unsigned)
   LILLETTE Ileana Collet, PhD, LAT, ATC acting as a scribe for Artist Lloyd, MD.  Kelly Bush is a 48 y.o. male who presents to [facility] today for R***L knee pain. Pt was previously seen by Dr. Claudene in 2023 for OMT.  Today, pt c/o R***L knee pain x ***. Pt locates pain to ***  Knee swelling: Mechanical symptoms: Aggravates: Treatments tried:  Pt also c/o Achilles pain x ***.   Pertinent review of systems: ***  Relevant historical information: ***   Exam:  There were no vitals taken for this visit. General: Well Developed, well nourished, and in no acute distress.   MSK: ***    Lab and Radiology Results No results found for this or any previous visit (from the past 72 hours). No results found.     Assessment and Plan: 48 y.o. male with ***   PDMP not reviewed this encounter. No orders of the defined types were placed in this encounter.  No orders of the defined types were placed in this encounter.    Discussed warning signs or symptoms. Please see discharge instructions. Patient expresses understanding.   ***

## 2024-01-22 ENCOUNTER — Ambulatory Visit: Admitting: Family Medicine

## 2024-01-22 ENCOUNTER — Encounter: Payer: Self-pay | Admitting: Family Medicine

## 2024-01-22 ENCOUNTER — Other Ambulatory Visit: Payer: Self-pay

## 2024-01-22 VITALS — BP 104/68 | HR 61 | Ht 70.0 in | Wt 170.0 lb

## 2024-01-22 DIAGNOSIS — M25561 Pain in right knee: Secondary | ICD-10-CM

## 2024-01-22 DIAGNOSIS — M7660 Achilles tendinitis, unspecified leg: Secondary | ICD-10-CM | POA: Diagnosis not present

## 2024-01-22 DIAGNOSIS — G8929 Other chronic pain: Secondary | ICD-10-CM

## 2024-01-22 NOTE — Patient Instructions (Signed)
 Thank you for coming in today.   Please work on the home exercises the athletic trainer went over with you:  View at my-exercise-code.com code [email or web address]  Please use Voltaren  gel (Generic Diclofenac  Gel) up to 4x daily for pain as needed.  This is available over-the-counter as both the name brand Voltaren  gel and the generic diclofenac  gel.   Let me know if you would like a referral to physical therapy
